# Patient Record
Sex: Female | Born: 1965 | Race: White | Hispanic: No | Marital: Married | Smoking: Never smoker
Health system: Southern US, Community
[De-identification: ages and names within clinical notes are randomized; demographics above are authoritative.]

## PROBLEM LIST (undated history)

## (undated) DIAGNOSIS — I499 Cardiac arrhythmia, unspecified: Secondary | ICD-10-CM

## (undated) DIAGNOSIS — R112 Nausea with vomiting, unspecified: Secondary | ICD-10-CM

## (undated) DIAGNOSIS — Z9889 Other specified postprocedural states: Secondary | ICD-10-CM

## (undated) DIAGNOSIS — E039 Hypothyroidism, unspecified: Secondary | ICD-10-CM

## (undated) DIAGNOSIS — Z8719 Personal history of other diseases of the digestive system: Secondary | ICD-10-CM

## (undated) DIAGNOSIS — F419 Anxiety disorder, unspecified: Secondary | ICD-10-CM

## (undated) DIAGNOSIS — R519 Headache, unspecified: Secondary | ICD-10-CM

## (undated) DIAGNOSIS — T7840XA Allergy, unspecified, initial encounter: Secondary | ICD-10-CM

## (undated) DIAGNOSIS — M1990 Unspecified osteoarthritis, unspecified site: Secondary | ICD-10-CM

## (undated) DIAGNOSIS — J342 Deviated nasal septum: Secondary | ICD-10-CM

## (undated) DIAGNOSIS — J45909 Unspecified asthma, uncomplicated: Secondary | ICD-10-CM

## (undated) DIAGNOSIS — E7800 Pure hypercholesterolemia, unspecified: Secondary | ICD-10-CM

## (undated) HISTORY — PX: CARPAL TUNNEL RELEASE: SHX101

## (undated) HISTORY — PX: KNEE ARTHROSCOPY: SUR90

## (undated) HISTORY — DX: Allergy, unspecified, initial encounter: T78.40XA

## (undated) HISTORY — DX: Unspecified asthma, uncomplicated: J45.909

## (undated) HISTORY — PX: JOINT REPLACEMENT: SHX530

## (undated) HISTORY — DX: Pure hypercholesterolemia, unspecified: E78.00

---

## 2003-09-11 ENCOUNTER — Encounter: Admission: RE | Admit: 2003-09-11 | Discharge: 2003-09-11 | Payer: Self-pay | Admitting: Family Medicine

## 2004-10-02 ENCOUNTER — Encounter: Admission: RE | Admit: 2004-10-02 | Discharge: 2004-10-02 | Payer: Self-pay | Admitting: Family Medicine

## 2004-10-29 ENCOUNTER — Ambulatory Visit (HOSPITAL_COMMUNITY): Admission: RE | Admit: 2004-10-29 | Discharge: 2004-10-29 | Payer: Self-pay | Admitting: Orthopedic Surgery

## 2004-10-29 ENCOUNTER — Ambulatory Visit (HOSPITAL_BASED_OUTPATIENT_CLINIC_OR_DEPARTMENT_OTHER): Admission: RE | Admit: 2004-10-29 | Discharge: 2004-10-29 | Payer: Self-pay | Admitting: Orthopedic Surgery

## 2004-11-19 ENCOUNTER — Ambulatory Visit (HOSPITAL_BASED_OUTPATIENT_CLINIC_OR_DEPARTMENT_OTHER): Admission: RE | Admit: 2004-11-19 | Discharge: 2004-11-19 | Payer: Self-pay | Admitting: Orthopedic Surgery

## 2004-11-19 ENCOUNTER — Ambulatory Visit (HOSPITAL_COMMUNITY): Admission: RE | Admit: 2004-11-19 | Discharge: 2004-11-19 | Payer: Self-pay | Admitting: Orthopedic Surgery

## 2007-11-25 ENCOUNTER — Emergency Department (HOSPITAL_COMMUNITY): Admission: EM | Admit: 2007-11-25 | Discharge: 2007-11-25 | Payer: Self-pay | Admitting: Emergency Medicine

## 2008-05-31 ENCOUNTER — Encounter: Admission: RE | Admit: 2008-05-31 | Discharge: 2008-05-31 | Payer: Self-pay | Admitting: Obstetrics and Gynecology

## 2008-12-09 ENCOUNTER — Telehealth: Payer: Self-pay | Admitting: Family Medicine

## 2010-06-12 NOTE — Op Note (Signed)
NAMEAHMARI, GARTON               ACCOUNT NO.:  [id number]   MEDICAL RECORD NO.:  [id number]          PATIENT TYPE:  AMB   LOCATION:  DSC                          FACILITY:  [facility]   PHYSICIAN:  Loreta Ave, M.D. DATE OF BIRTH:  08/23/1965   DATE OF PROCEDURE:  10/29/2004  DATE OF DISCHARGE:                                 OPERATIVE REPORT   PREOPERATIVE DIAGNOSIS:  Carpal tunnel syndrome, right.   POSTOPERATIVE DIAGNOSIS:  Carpal tunnel syndrome, right.   OPERATIVE PROCEDURE:  Carpal tunnel release, right.   SURGEON:  Loreta Ave, M.D.   ASSISTANT:  Genene Churn. Denton Meek.   ANESTHESIA:  IV regional.   SPECIMENS:  None.   CULTURES:  None.   COMPLICATIONS:  None.   DRESSING:  Soft compressive with a well-padded hand dressing and splint.   PROCEDURE:  The patient brought to the operating room and after adequate  anesthesia had been obtained, tourniquet applied and anesthesia started with  IV regional.  Prepped and draped in the usual sterile fashion.  A small  incision on volar aspect of right wrist over carpal tunnel.  Skin and  subcutaneous tissues divided, avoiding injury to the palmar branch of median  nerve.  Retinaculum over the carpal tunnel identified and incised under  direct visualization from the forearm fascia proximally to the palmar arch  distally.  Marked constriction, edema, erythema of the median nerve which  was pretty much flattened in the carpal canal.  Completely decompressed  including epineurotomy.  Digital branches and motor branches identified,  decompressed and protected.  Improvement of the nerve after completion but  still a pattern of erythema and bruising right in the middle of the canal  even after completion.  No other abnormalities in the canal.  Irrigated.  Hypertrophic synovial tissue removed.  Skin closed with nylon.  Margins were  injected with Marcaine.  Still compressive dressing with short-arm well-  padded hand dressing and  splint applied.  Anesthesia reversed.  Brought to  the recovery room.  Tolerated the surgery well.  No complications.     Loreta Ave, M.D.  Electronically Signed    DFM/MEDQ  D:  10/29/2004  T:  10/29/2004  Job:  [id number]

## 2010-06-12 NOTE — Op Note (Signed)
NAMESHARICE, Shannon Mccullough               ACCOUNT NO.:  [id number]   MEDICAL RECORD NO.:  [id number]          PATIENT TYPE:  AMB   LOCATION:  DSC                          FACILITY:  [facility]   PHYSICIAN:  Loreta Ave, M.D. DATE OF BIRTH:  1965/06/25   DATE OF PROCEDURE:  11/19/2004  DATE OF DISCHARGE:                                 OPERATIVE REPORT   PREOPERATIVE DIAGNOSIS:  Left carpal tunnel syndrome.   POSTOPERATIVE DIAGNOSIS:  Left carpal tunnel syndrome.   PROCEDURE:  Left carpal tunnel release.   SURGEON:  Loreta Ave, M.D.   ASSISTANT:  Genene Churn. Denton Meek.   ANESTHESIA:  IV regional.   SPECIMENS:  None.   CULTURES:  None.   COMPLICATIONS:  None.   DRESSINGS:  Soft compressive with a bulky hand dressing and splint.   DESCRIPTION OF PROCEDURE:  The patient was brought to the operating room,  and after adequate anesthesia had been obtained, she was prepped and draped  in the usual sterile fashion.   A small incision over the volar aspect of the carpal tunnel heading slightly  ulnarward at the distal wrist crease to avoid injury to the palmar branch of  the median nerve.  The skin and subcutaneous tissue were divided.  The  retinaculum over the carpal tunnel was incised under direct visualization  from the forearm fascia proximally to the palmar arch distally.  The digital  branches and motor branches were identified, protected, and decompressed.  Moderate constriction and erythema on the nerve right in the middle of the  canal improved after carpal tunnel release and aponeurotomy.  Completely  decompressed.  No other abnormalities.  Wound irrigated.  Skin closed with  mattress nylon suture.  Sterile compressive dressing applied.  Bulky hand  dressing and splint applied.  Anesthesia reversed.   The patient was brought to the recovery room.  She tolerated the surgery  well, and there were no complications.      Loreta Ave, M.D.  Electronically  Signed     DFM/MEDQ  D:  11/19/2004  T:  11/20/2004  Job:  [id number]

## 2010-10-26 LAB — POCT I-STAT, CHEM 8
BUN: 16
Chloride: 108
HCT: 37
Hemoglobin: 12.6
Potassium: 4.2
Sodium: 140
TCO2: 25

## 2010-10-26 LAB — POCT CARDIAC MARKERS: CKMB, poc: <1 — ABNORMAL LOW

## 2012-06-14 ENCOUNTER — Ambulatory Visit
Admission: RE | Admit: 2012-06-14 | Discharge: 2012-06-14 | Disposition: A | Discharge status: Home / Self Care | Payer: BC Managed Care – PPO | Source: Ambulatory Visit | Attending: Family Medicine | Admitting: Family Medicine

## 2012-06-14 ENCOUNTER — Other Ambulatory Visit: Payer: Self-pay | Admitting: Family Medicine

## 2012-06-14 DIAGNOSIS — M542 Cervicalgia: Secondary | ICD-10-CM

## 2012-06-14 DIAGNOSIS — R51 Headache: Secondary | ICD-10-CM

## 2012-11-16 ENCOUNTER — Encounter: Payer: BC Managed Care – PPO | Attending: Family Medicine | Admitting: Dietician

## 2012-11-16 ENCOUNTER — Encounter: Payer: Self-pay | Admitting: Dietician

## 2012-11-16 VITALS — Ht 62.0 in | Wt 132.7 lb

## 2012-11-16 DIAGNOSIS — Z713 Dietary counseling and surveillance: Secondary | ICD-10-CM | POA: Insufficient documentation

## 2012-11-16 DIAGNOSIS — R7309 Other abnormal glucose: Secondary | ICD-10-CM | POA: Insufficient documentation

## 2012-11-16 NOTE — Progress Notes (Signed)
  Medical Nutrition Therapy:  Appt start time: 1500 end time:  1600.  Assessment:  Primary concerns today: Shannon Mccullough is here today since her blood glucose is elevated. Fasting glucose was 108, and 2-hour OGTT was 141 and has a history of gestational diabetes. BMI is 24.3.   Shannon Mccullough works at [employer] and lives with husband and has two daughters in college. One of her daughters has Type 1 diabetes so she is familiar with counting carbs. States that she does the food shopping and preparing in her household.   Preferred Learning Style:   No preference indicated   Learning Readiness:  Change in progress  MEDICATIONS: see list   DIETARY INTAKE:  Usual eating pattern includes 3 meals and 3 snacks per day.  Avoided foods include red meat    24-hr recall:  B ( AM): kashi bar with diet soda   Snk ( AM): yogurt with diet soda  L ( PM): leftovers or frozen healthy choice lunch Snk ( PM): nuts with cranberries or cheese stick D ( PM): pork tenderloin and couple vegetables maybe with rice, usually with no starch  Snk ( PM): vanilla wafers and nutella Beverages: diet soda (about 3-4 12 oz) or zero calorie flavored water  Usual physical activity: walk the dog or walk with friends 2 x week for about an hour  Estimated energy needs: 1800 calories 200 g carbohydrates 135 g protein 50 g fat  Progress Towards Goal(s):  In progress.   Nutritional Diagnosis:  [place]-2.1 Inpaired nutrition utilization As related to glucose metabolism.  As evidenced by elevated fasting glucose and OGTT.    Intervention:  Nutrition counseling provided. Recommended that Shannon Mccullough consider adding more frequent exercise week and consider cutting back on artificial sweeteners in case they affecting her blood sugar. Also suggested working on managing stress to help keep blood sugar from going too high.   Plan: Aim to get 30 minutes of exercise 5 x per week. Continue to spread carbs out through the day, about 30-60g per meal, 15g  per snack if needed. Consider drinking mostly water or drinks without artificial sweeteners (or tapering down). Work on managing stress and having good sleep habits (if needed). Consider having an Hgb A1c test done at next appointment.   Teaching Method Utilized: Visual - picture of insulin resistance from Living Well with Diabetes Auditory - made recommendations about exercise, spreading out carbs, and managing stress  Handouts given during visit include:  15 g CHO Snacks  Barriers to learning/adherence to lifestyle change: none  Demonstrated degree of understanding via Teach Back   Monitoring/Evaluation:  Dietary intake, exercise, and body weight prn.

## 2012-11-16 NOTE — Patient Instructions (Signed)
Aim to get 30 minutes of exercise 5 x per week. Continue to spread carbs out through the day, about 30-60g per meal, 15g per snack if needed. Consider drinking mostly water or drinks without artificial sweeteners (or tapering down). Work on managing stress and having good sleep habits (if needed). Consider having an Hgb A1c test done at next appointment.

## 2015-04-28 DIAGNOSIS — S83242D Other tear of medial meniscus, current injury, left knee, subsequent encounter: Secondary | ICD-10-CM | POA: Diagnosis not present

## 2015-05-02 DIAGNOSIS — M25562 Pain in left knee: Secondary | ICD-10-CM | POA: Diagnosis not present

## 2015-05-07 DIAGNOSIS — M25562 Pain in left knee: Secondary | ICD-10-CM | POA: Diagnosis not present

## 2015-06-25 DIAGNOSIS — M25562 Pain in left knee: Secondary | ICD-10-CM | POA: Diagnosis not present

## 2015-10-08 DIAGNOSIS — E039 Hypothyroidism, unspecified: Secondary | ICD-10-CM | POA: Diagnosis not present

## 2015-10-08 DIAGNOSIS — Z23 Encounter for immunization: Secondary | ICD-10-CM | POA: Diagnosis not present

## 2015-10-08 DIAGNOSIS — R739 Hyperglycemia, unspecified: Secondary | ICD-10-CM | POA: Diagnosis not present

## 2015-10-08 DIAGNOSIS — J309 Allergic rhinitis, unspecified: Secondary | ICD-10-CM | POA: Diagnosis not present

## 2015-10-08 DIAGNOSIS — E785 Hyperlipidemia, unspecified: Secondary | ICD-10-CM | POA: Diagnosis not present

## 2015-12-01 DIAGNOSIS — N952 Postmenopausal atrophic vaginitis: Secondary | ICD-10-CM | POA: Diagnosis not present

## 2015-12-01 DIAGNOSIS — N390 Urinary tract infection, site not specified: Secondary | ICD-10-CM | POA: Diagnosis not present

## 2015-12-01 DIAGNOSIS — N760 Acute vaginitis: Secondary | ICD-10-CM | POA: Diagnosis not present

## 2016-01-21 DIAGNOSIS — M1712 Unilateral primary osteoarthritis, left knee: Secondary | ICD-10-CM | POA: Diagnosis not present

## 2016-05-05 DIAGNOSIS — M1712 Unilateral primary osteoarthritis, left knee: Secondary | ICD-10-CM | POA: Diagnosis not present

## 2016-06-01 DIAGNOSIS — Z01419 Encounter for gynecological examination (general) (routine) without abnormal findings: Secondary | ICD-10-CM | POA: Diagnosis not present

## 2016-06-01 DIAGNOSIS — Z1231 Encounter for screening mammogram for malignant neoplasm of breast: Secondary | ICD-10-CM | POA: Diagnosis not present

## 2016-06-01 DIAGNOSIS — N760 Acute vaginitis: Secondary | ICD-10-CM | POA: Diagnosis not present

## 2016-06-01 DIAGNOSIS — Z6825 Body mass index (BMI) 25.0-25.9, adult: Secondary | ICD-10-CM | POA: Diagnosis not present

## 2016-07-23 DIAGNOSIS — M25562 Pain in left knee: Secondary | ICD-10-CM | POA: Diagnosis not present

## 2016-07-23 DIAGNOSIS — G8929 Other chronic pain: Secondary | ICD-10-CM | POA: Diagnosis not present

## 2016-07-31 DIAGNOSIS — M25562 Pain in left knee: Secondary | ICD-10-CM | POA: Diagnosis not present

## 2016-07-31 DIAGNOSIS — G8929 Other chronic pain: Secondary | ICD-10-CM | POA: Diagnosis not present

## 2016-08-27 DIAGNOSIS — G8929 Other chronic pain: Secondary | ICD-10-CM | POA: Diagnosis not present

## 2016-08-27 DIAGNOSIS — M25562 Pain in left knee: Secondary | ICD-10-CM | POA: Diagnosis not present

## 2016-08-27 DIAGNOSIS — M1712 Unilateral primary osteoarthritis, left knee: Secondary | ICD-10-CM | POA: Diagnosis not present

## 2016-09-08 DIAGNOSIS — Z0000 Encounter for general adult medical examination without abnormal findings: Secondary | ICD-10-CM | POA: Diagnosis not present

## 2016-09-08 DIAGNOSIS — E785 Hyperlipidemia, unspecified: Secondary | ICD-10-CM | POA: Diagnosis not present

## 2016-09-08 DIAGNOSIS — E039 Hypothyroidism, unspecified: Secondary | ICD-10-CM | POA: Diagnosis not present

## 2016-09-08 DIAGNOSIS — R7303 Prediabetes: Secondary | ICD-10-CM | POA: Diagnosis not present

## 2016-10-15 DIAGNOSIS — M1712 Unilateral primary osteoarthritis, left knee: Secondary | ICD-10-CM | POA: Diagnosis not present

## 2016-10-22 DIAGNOSIS — M1712 Unilateral primary osteoarthritis, left knee: Secondary | ICD-10-CM | POA: Diagnosis not present

## 2016-10-29 DIAGNOSIS — M1712 Unilateral primary osteoarthritis, left knee: Secondary | ICD-10-CM | POA: Diagnosis not present

## 2016-12-23 DIAGNOSIS — M1712 Unilateral primary osteoarthritis, left knee: Secondary | ICD-10-CM | POA: Diagnosis not present

## 2016-12-23 DIAGNOSIS — M25561 Pain in right knee: Secondary | ICD-10-CM | POA: Diagnosis not present

## 2017-02-04 DIAGNOSIS — R1031 Right lower quadrant pain: Secondary | ICD-10-CM | POA: Diagnosis not present

## 2017-02-16 DIAGNOSIS — M62838 Other muscle spasm: Secondary | ICD-10-CM | POA: Diagnosis not present

## 2017-02-16 DIAGNOSIS — M6281 Muscle weakness (generalized): Secondary | ICD-10-CM | POA: Diagnosis not present

## 2017-02-16 DIAGNOSIS — K5900 Constipation, unspecified: Secondary | ICD-10-CM | POA: Diagnosis not present

## 2017-02-16 DIAGNOSIS — N941 Unspecified dyspareunia: Secondary | ICD-10-CM | POA: Diagnosis not present

## 2017-02-23 DIAGNOSIS — M1712 Unilateral primary osteoarthritis, left knee: Secondary | ICD-10-CM | POA: Diagnosis not present

## 2017-02-24 DIAGNOSIS — K5900 Constipation, unspecified: Secondary | ICD-10-CM | POA: Diagnosis not present

## 2017-02-24 DIAGNOSIS — N941 Unspecified dyspareunia: Secondary | ICD-10-CM | POA: Diagnosis not present

## 2017-02-24 DIAGNOSIS — M62838 Other muscle spasm: Secondary | ICD-10-CM | POA: Diagnosis not present

## 2017-02-24 DIAGNOSIS — M6281 Muscle weakness (generalized): Secondary | ICD-10-CM | POA: Diagnosis not present

## 2017-03-09 DIAGNOSIS — M6281 Muscle weakness (generalized): Secondary | ICD-10-CM | POA: Diagnosis not present

## 2017-03-09 DIAGNOSIS — N941 Unspecified dyspareunia: Secondary | ICD-10-CM | POA: Diagnosis not present

## 2017-03-09 DIAGNOSIS — K5900 Constipation, unspecified: Secondary | ICD-10-CM | POA: Diagnosis not present

## 2017-03-09 DIAGNOSIS — M62838 Other muscle spasm: Secondary | ICD-10-CM | POA: Diagnosis not present

## 2017-03-23 DIAGNOSIS — R109 Unspecified abdominal pain: Secondary | ICD-10-CM | POA: Diagnosis not present

## 2017-03-24 ENCOUNTER — Other Ambulatory Visit: Payer: Self-pay | Admitting: Family Medicine

## 2017-03-24 DIAGNOSIS — R109 Unspecified abdominal pain: Secondary | ICD-10-CM

## 2017-04-07 ENCOUNTER — Ambulatory Visit
Admission: RE | Admit: 2017-04-07 | Discharge: 2017-04-07 | Disposition: A | Discharge status: Home / Self Care | Payer: BLUE CROSS/BLUE SHIELD | Source: Ambulatory Visit | Attending: Family Medicine | Admitting: Family Medicine

## 2017-04-07 DIAGNOSIS — R1031 Right lower quadrant pain: Secondary | ICD-10-CM | POA: Diagnosis not present

## 2017-04-07 DIAGNOSIS — R109 Unspecified abdominal pain: Secondary | ICD-10-CM

## 2017-04-07 MED ORDER — IOPAMIDOL (ISOVUE-300) INJECTION 61%
100.0000 mL | Freq: Once | INTRAVENOUS | Status: AC | PRN
  Administered 2017-04-07: 100 mL via INTRAVENOUS

## 2017-07-13 DIAGNOSIS — Z1231 Encounter for screening mammogram for malignant neoplasm of breast: Secondary | ICD-10-CM | POA: Diagnosis not present

## 2017-07-13 DIAGNOSIS — Z6825 Body mass index (BMI) 25.0-25.9, adult: Secondary | ICD-10-CM | POA: Diagnosis not present

## 2017-07-13 DIAGNOSIS — Z01419 Encounter for gynecological examination (general) (routine) without abnormal findings: Secondary | ICD-10-CM | POA: Diagnosis not present

## 2017-07-13 DIAGNOSIS — Z1239 Encounter for other screening for malignant neoplasm of breast: Secondary | ICD-10-CM | POA: Diagnosis not present

## 2017-07-15 DIAGNOSIS — R1031 Right lower quadrant pain: Secondary | ICD-10-CM | POA: Diagnosis not present

## 2017-07-15 DIAGNOSIS — K469 Unspecified abdominal hernia without obstruction or gangrene: Secondary | ICD-10-CM | POA: Diagnosis not present

## 2017-07-15 DIAGNOSIS — J309 Allergic rhinitis, unspecified: Secondary | ICD-10-CM | POA: Diagnosis not present

## 2017-07-15 DIAGNOSIS — E039 Hypothyroidism, unspecified: Secondary | ICD-10-CM | POA: Diagnosis not present

## 2017-08-09 DIAGNOSIS — R109 Unspecified abdominal pain: Secondary | ICD-10-CM | POA: Diagnosis not present

## 2017-09-22 DIAGNOSIS — R7303 Prediabetes: Secondary | ICD-10-CM | POA: Diagnosis not present

## 2017-09-22 DIAGNOSIS — Z5181 Encounter for therapeutic drug level monitoring: Secondary | ICD-10-CM | POA: Diagnosis not present

## 2017-09-22 DIAGNOSIS — E039 Hypothyroidism, unspecified: Secondary | ICD-10-CM | POA: Diagnosis not present

## 2017-09-22 DIAGNOSIS — E785 Hyperlipidemia, unspecified: Secondary | ICD-10-CM | POA: Diagnosis not present

## 2017-09-23 DIAGNOSIS — F411 Generalized anxiety disorder: Secondary | ICD-10-CM | POA: Diagnosis not present

## 2017-09-23 DIAGNOSIS — Z23 Encounter for immunization: Secondary | ICD-10-CM | POA: Diagnosis not present

## 2017-09-23 DIAGNOSIS — Z0000 Encounter for general adult medical examination without abnormal findings: Secondary | ICD-10-CM | POA: Diagnosis not present

## 2017-09-28 DIAGNOSIS — H17822 Peripheral opacity of cornea, left eye: Secondary | ICD-10-CM | POA: Diagnosis not present

## 2017-10-28 DIAGNOSIS — R109 Unspecified abdominal pain: Secondary | ICD-10-CM | POA: Diagnosis not present

## 2017-12-15 DIAGNOSIS — H16142 Punctate keratitis, left eye: Secondary | ICD-10-CM | POA: Diagnosis not present

## 2018-06-01 ENCOUNTER — Ambulatory Visit: Payer: Self-pay | Admitting: Surgery

## 2018-06-01 DIAGNOSIS — R109 Unspecified abdominal pain: Secondary | ICD-10-CM | POA: Diagnosis not present

## 2018-06-01 NOTE — H&P (Signed)
Surgical H&P   CC: abdominal pain  HPI: this is a very nice and otherwise healthy 53 year old woman who I first met in July of last year.  She notes an intermittent bulge and pain in the right lower quadrant. This has been going on for about 2-1/2 years, starting after she had helped her parents move some heavy boxes. The bulge is more noticeable after physical activity, but she also notes that she has pain when her bladder is full.  This is her third visit to the office for this, at her last visit I recommended a diagnostic laparoscopy and possible repair because although I was never able to ascertain a hernia on exam, the history is consistent with a hernia. She is now experiencing this 3 or 4 times a week.  It is getting more difficult to reduce and she does have to lie down and try to relax her muscles to reduce it.  She does feel that stress aggravates it.  Denies any associated GI symptoms although she does have a history of chronic constipation. Her prior abdominal surgical history includes a C-section and a laparoscopy for ectopic pregnancy, neither of these incisions or near the region of her pain.  Allergies  Allergen Reactions  . Sulfa Antibiotics     Past Medical History:  Diagnosis Date  . Asthma     Past Surgical History:  Procedure Laterality Date  . CARPAL TUNNEL RELEASE    . CESAREAN SECTION    . KNEE ARTHROSCOPY      Family History  Problem Relation Age of Onset  . Cancer Other   . Hyperlipidemia Other   . Hypertension Other   . Diabetes Other     Social History   Socioeconomic History  . Marital status: Married    Spouse name: Not on file  . Number of children: Not on file  . Years of education: Not on file  . Highest education level: Not on file  Occupational History  . Not on file  Social Needs  . Financial resource strain: Not on file  . Food insecurity:    Worry: Not on file    Inability: Not on file  . Transportation needs:    Medical: Not on  file    Non-medical: Not on file  Tobacco Use  . Smoking status: Not on file  Substance and Sexual Activity  . Alcohol use: Not on file  . Drug use: Not on file  . Sexual activity: Not on file  Lifestyle  . Physical activity:    Days per week: Not on file    Minutes per session: Not on file  . Stress: Not on file  Relationships  . Social connections:    Talks on phone: Not on file    Gets together: Not on file    Attends religious service: Not on file    Active member of club or organization: Not on file    Attends meetings of clubs or organizations: Not on file    Relationship status: Not on file  Other Topics Concern  . Not on file  Social History Narrative  . Not on file    Current Outpatient Medications on File Prior to Visit  Medication Sig Dispense Refill  . CALCIUM-VITAMIN D PO Take by mouth.    . cetirizine (ZYRTEC) 10 MG tablet Take 10 mg by mouth daily.    Marland Kitchen guaiFENesin (MUCINEX) 600 MG 12 hr tablet Take 1,200 mg by mouth 2 (two) times daily.    Marland Kitchen  levothyroxine (SYNTHROID, LEVOTHROID) 100 MCG tablet Take 100 mcg by mouth daily before breakfast.    . Multiple Vitamin (MULTIVITAMIN WITH MINERALS) TABS tablet Take 1 tablet by mouth daily.    . sertraline (ZOLOFT) 100 MG tablet Take 100 mg by mouth daily.    . vitamin C (ASCORBIC ACID) 500 MG tablet Take 500 mg by mouth daily.     No current facility-administered medications on file prior to visit.     Review of Systems: a complete, 10pt review of systems was completed with pertinent positives and negatives as documented in the HPI  Physical Exam: There were no vitals filed for this visit. Gen: A&Ox3, no distress  Head: normocephalic, atraumatic Eyes: extraocular motions intact, anicteric.  Neck: supple without mass or thyromegaly Chest: unlabored respirations, symmetrical air entry, clear bilaterally   Cardiovascular: RRR with palpable distal pulses, no pedal edema Abdomen: soft, nondistended, nontender. No  mass or organomegaly.  Extremities: warm, without edema, no deformities  Neuro: grossly intact Psych: appropriate mood and affect, normal insight  Skin: warm and dry   CBC 11/25/2007  Hemoglobin 12.6  Hematocrit 37.0    CMP 11/25/2007  Glucose 106(H)  BUN 16  Creatinine 1.0  Sodium 140  Potassium 4.2  Chloride 108    No results found for: INR, PROTIME  Imaging: No results found.    A/P: Again, her history sounds like she has an abdominal wall hernia. I discussed with her the possibility of a spigelian hernia which can be difficult to appreciate on exam but given the location and symptoms she describes I think this is the most likely diagnosis. I looked again at her CT scan, there is no overt hernia or defect, but on a single slice near the level of the ASIS, the posterior fascia is difficult to delineate, the remainder of the skan it is easy to see. I again offered her diagnostic laparoscopy with concomitant hernia repair if confirmed. We discussed both the transabdominal preperitoneal approach in the intraperitoneal onlay approach both using mesh. We discussed the risks of surgery including bleeding, infection, pain, scarring, injury to intra-abdominal injury, hernia recurrence, failure to resolve symptoms, conversion to open surgery, as well as risks of blood clots, pneumonia, heart attack, stroke, etc. We discussed signs and symptoms of incarceration that should prompt emergency evaluation and she states understanding. She is ready to proceed with scheduling.   Romana Juniper, MD [facility] Surgery, [place] Pager [phone]

## 2018-07-02 NOTE — H&P (Signed)
Surgical H&P   CC: abdominal pain  HPI: this is a very nice and otherwise healthy 53 year old woman who I first met in July of last year.  She notes an intermittent bulge and pain in the right lower quadrant. This has been going on for about 2-1/2 years, starting after she had helped her parents move some heavy boxes. The bulge is more noticeable after physical activity, but she also notes that she has pain when her bladder is full.  This is her third visit to the office for this, at her last visit I recommended a diagnostic laparoscopy and possible repair because although I was never able to ascertain a hernia on exam, the history is consistent with a hernia. She is now experiencing this 3 or 4 times a week.  It is getting more difficult to reduce and she does have to lie down and try to relax her muscles to reduce it.  She does feel that stress aggravates it.  Denies any associated GI symptoms although she does have a history of chronic constipation. Her prior abdominal surgical history includes a C-section and a laparoscopy for ectopic pregnancy, neither of these incisions or near the region of her pain.      Allergies  Allergen Reactions  . Sulfa Antibiotics         Past Medical History:  Diagnosis Date  . Asthma          Past Surgical History:  Procedure Laterality Date  . CARPAL TUNNEL RELEASE    . CESAREAN SECTION    . KNEE ARTHROSCOPY           Family History  Problem Relation Age of Onset  . Cancer Other   . Hyperlipidemia Other   . Hypertension Other   . Diabetes Other     Social History        Socioeconomic History  . Marital status: Married    Spouse name: Not on file  . Number of children: Not on file  . Years of education: Not on file  . Highest education level: Not on file  Occupational History  . Not on file  Social Needs  . Financial resource strain: Not on file  . Food insecurity:    Worry: Not on file    Inability:  Not on file  . Transportation needs:    Medical: Not on file    Non-medical: Not on file  Tobacco Use  . Smoking status: Not on file  Substance and Sexual Activity  . Alcohol use: Not on file  . Drug use: Not on file  . Sexual activity: Not on file  Lifestyle  . Physical activity:    Days per week: Not on file    Minutes per session: Not on file  . Stress: Not on file  Relationships  . Social connections:    Talks on phone: Not on file    Gets together: Not on file    Attends religious service: Not on file    Active member of club or organization: Not on file    Attends meetings of clubs or organizations: Not on file    Relationship status: Not on file  Other Topics Concern  . Not on file  Social History Narrative  . Not on file          Current Outpatient Medications on File Prior to Visit  Medication Sig Dispense Refill  . CALCIUM-VITAMIN D PO Take by mouth.    . cetirizine (ZYRTEC) 10  MG tablet Take 10 mg by mouth daily.    Marland Kitchen guaiFENesin (MUCINEX) 600 MG 12 hr tablet Take 1,200 mg by mouth 2 (two) times daily.    Marland Kitchen levothyroxine (SYNTHROID, LEVOTHROID) 100 MCG tablet Take 100 mcg by mouth daily before breakfast.    . Multiple Vitamin (MULTIVITAMIN WITH MINERALS) TABS tablet Take 1 tablet by mouth daily.    . sertraline (ZOLOFT) 100 MG tablet Take 100 mg by mouth daily.    . vitamin C (ASCORBIC ACID) 500 MG tablet Take 500 mg by mouth daily.     No current facility-administered medications on file prior to visit.     Review of Systems: a complete, 10pt review of systems was completed with pertinent positives and negatives as documented in the HPI  Physical Exam: There were no vitals filed for this visit. Gen: A&Ox3, no distress  Head: normocephalic, atraumatic Eyes: extraocular motions intact, anicteric.  Neck: supple without mass or thyromegaly Chest: unlabored respirations, symmetrical air entry, clear bilaterally    Cardiovascular: RRR with palpable distal pulses, no pedal edema Abdomen: soft, nondistended, nontender. No mass or organomegaly.  Extremities: warm, without edema, no deformities  Neuro: grossly intact Psych: appropriate mood and affect, normal insight  Skin: warm and dry   CBC 11/25/2007  Hemoglobin 12.6  Hematocrit 37.0    CMP 11/25/2007  Glucose 106(H)  BUN 16  Creatinine 1.0  Sodium 140  Potassium 4.2  Chloride 108    RecentLabs  No results found for: INR, PROTIME    Imaging: ImagingResults(Last48hours)  No results found.      A/P: Again, her history sounds like she has an abdominal wall hernia. I discussed with her the possibility of a spigelian hernia which can be difficult to appreciate on exam but given the location and symptoms she describes I think this is the most likely diagnosis. I looked again at her CT scan, there is no overt hernia or defect, but on a single slice near the level of the ASIS, the posterior fascia is difficult to delineate, the remainder of the skan it is easy to see. I again offered her diagnostic laparoscopy with concomitant hernia repair if confirmed. We discussed both the transabdominal preperitoneal approach in the intraperitoneal onlay approach both using mesh. We discussed the risks of surgery including bleeding, infection, pain, scarring, injury to intra-abdominal injury, hernia recurrence, failure to resolve symptoms, conversion to open surgery, as well as risks of blood clots, pneumonia, heart attack, stroke, etc. We discussed signs and symptoms of incarceration that should prompt emergency evaluation and she states understanding. She is ready to proceed with scheduling.   Romana Juniper, MD [facility] Surgery, [place] Pager [phone]

## 2018-07-03 ENCOUNTER — Encounter (HOSPITAL_COMMUNITY): Payer: Self-pay

## 2018-07-03 NOTE — Pre-Procedure Instructions (Signed)
Shannon Mccullough  07/03/2018     [id number] - [place], [place] - [address] [place] [address] Phone: [phone] Fax: [phone]   Your procedure is scheduled on Wednesday, June 10th  Report to [facility] Entrance A at 11:30 A.M.  Call this number if you have problems the morning of surgery:  [phone]   Remember:  Do not eat or drink after midnight.    Take these medicines the morning of surgery with A SIP OF WATER  levothyroxine (SYNTHROID)   If needed - acetaminophen (TYLENOL) , fluticasone (FLONASE)  Follow your surgeon's instructions on when to stop Aspirin.  If no instructions were given by your surgeon then you will need to call the office to get those instructions.     7 days prior to surgery STOP taking any Aspirin (unless otherwise instructed by your surgeon), Aleve, Naproxen, Ibuprofen, Motrin, Advil, Goody's, BC's, all herbal medications, fish oil, and all vitamins.    Do not wear jewelry, make-up or nail polish.  Do not wear lotions, powders, or perfumes, or deodorant.  Do not shave 48 hours prior to surgery.  Men may shave face and neck.  Do not bring valuables to the hospital.  [facility] is not responsible for any belongings or valuables.  Contacts, dentures or bridgework may not be worn into surgery.  Leave your suitcase in the car.  After surgery it may be brought to your room.  For patients admitted to the hospital, discharge time will be determined by your treatment team.  Patients discharged the day of surgery will not be allowed to drive home.   Special instructions:  [facility]- Preparing For Surgery  Before surgery, you can play an important role. Because skin is not sterile, your skin needs to be as free of germs as possible. You can reduce the number of germs on your skin by washing with CHG (chlorahexidine gluconate) Soap before surgery.  CHG is an antiseptic  cleaner which kills germs and bonds with the skin to continue killing germs even after washing.    Oral Hygiene is also important to reduce your risk of infection.  Remember - BRUSH YOUR TEETH THE MORNING OF SURGERY WITH YOUR REGULAR TOOTHPASTE  Please do not use if you have an allergy to CHG or antibacterial soaps. If your skin becomes reddened/irritated stop using the CHG.  Do not shave (including legs and underarms) for at least 48 hours prior to first CHG shower. It is OK to shave your face.  Please follow these instructions carefully.   1. Shower the NIGHT BEFORE SURGERY and the MORNING OF SURGERY with CHG.   2. If you chose to wash your hair, wash your hair first as usual with your normal shampoo.  3. After you shampoo, rinse your hair and body thoroughly to remove the shampoo.  4. Use CHG as you would any other liquid soap. You can apply CHG directly to the skin and wash gently with a scrungie or a clean washcloth.   5. Apply the CHG Soap to your body ONLY FROM THE NECK DOWN.  Do not use on open wounds or open sores. Avoid contact with your eyes, ears, mouth and genitals (private parts). Wash Face and genitals (private parts)  with your normal soap.  6. Wash thoroughly, paying special attention to the area where your surgery will be performed.  7. Thoroughly rinse your body with warm water  from the neck down.  8. DO NOT shower/wash with your normal soap after using and rinsing off the CHG Soap.  9. Pat yourself dry with a CLEAN TOWEL.  10. Wear CLEAN PAJAMAS to bed the night before surgery, wear comfortable clothes the morning of surgery  11. Place CLEAN SHEETS on your bed the night of your first shower and DO NOT SLEEP WITH PETS.  Day of Surgery:  Do not apply any deodorants/lotions.  Please wear clean clothes to the hospital/surgery center.   Remember to brush your teeth WITH YOUR REGULAR TOOTHPASTE.  Please read over the following fact sheets that you were given. Pain  Booklet and Coughing and Deep Breathing

## 2018-07-04 ENCOUNTER — Encounter (HOSPITAL_COMMUNITY): Payer: Self-pay

## 2018-07-04 ENCOUNTER — Other Ambulatory Visit: Payer: Self-pay

## 2018-07-04 ENCOUNTER — Encounter (HOSPITAL_COMMUNITY)
Admission: RE | Admit: 2018-07-04 | Discharge: 2018-07-04 | Disposition: A | Discharge status: Home / Self Care | Payer: BC Managed Care – PPO | Source: Ambulatory Visit | Attending: Surgery | Admitting: Surgery

## 2018-07-04 ENCOUNTER — Other Ambulatory Visit (HOSPITAL_COMMUNITY)
Admission: RE | Admit: 2018-07-04 | Discharge: 2018-07-04 | Disposition: A | Discharge status: Home / Self Care | Payer: BC Managed Care – PPO | Source: Ambulatory Visit | Attending: Surgery | Admitting: Surgery

## 2018-07-04 DIAGNOSIS — Z79890 Hormone replacement therapy (postmenopausal): Secondary | ICD-10-CM | POA: Insufficient documentation

## 2018-07-04 DIAGNOSIS — Z7951 Long term (current) use of inhaled steroids: Secondary | ICD-10-CM | POA: Diagnosis not present

## 2018-07-04 DIAGNOSIS — Z79899 Other long term (current) drug therapy: Secondary | ICD-10-CM | POA: Diagnosis not present

## 2018-07-04 DIAGNOSIS — K439 Ventral hernia without obstruction or gangrene: Secondary | ICD-10-CM | POA: Diagnosis not present

## 2018-07-04 DIAGNOSIS — Z01818 Encounter for other preprocedural examination: Secondary | ICD-10-CM | POA: Diagnosis not present

## 2018-07-04 DIAGNOSIS — E039 Hypothyroidism, unspecified: Secondary | ICD-10-CM | POA: Insufficient documentation

## 2018-07-04 DIAGNOSIS — Z1159 Encounter for screening for other viral diseases: Secondary | ICD-10-CM | POA: Insufficient documentation

## 2018-07-04 DIAGNOSIS — J45909 Unspecified asthma, uncomplicated: Secondary | ICD-10-CM | POA: Diagnosis not present

## 2018-07-04 HISTORY — DX: Other specified postprocedural states: Z98.890

## 2018-07-04 HISTORY — DX: Nausea with vomiting, unspecified: R11.2

## 2018-07-04 HISTORY — DX: Personal history of other diseases of the digestive system: Z87.19

## 2018-07-04 HISTORY — DX: Anxiety disorder, unspecified: F41.9

## 2018-07-04 HISTORY — DX: Unspecified osteoarthritis, unspecified site: M19.90

## 2018-07-04 HISTORY — DX: Cardiac arrhythmia, unspecified: I49.9

## 2018-07-04 HISTORY — DX: Hypothyroidism, unspecified: E03.9

## 2018-07-04 HISTORY — DX: Headache, unspecified: R51.9

## 2018-07-04 HISTORY — DX: Deviated nasal septum: J34.2

## 2018-07-04 LAB — BASIC METABOLIC PANEL
Anion gap: 8 (ref 5–15)
BUN: 12 mg/dL (ref 6–20)
CO2: 25 mmol/L (ref 22–32)
Calcium: 9.3 mg/dL (ref 8.9–10.3)
Chloride: 106 mmol/L (ref 98–111)
Creatinine, Ser: 0.8 mg/dL (ref 0.44–1.00)
GFR calc Af Amer: >60 mL/min (ref >60)
GFR calc non Af Amer: >60 mL/min (ref >60)
Glucose, Bld: 84 mg/dL (ref 70–99)
Potassium: 4.2 mmol/L (ref 3.5–5.1)
Sodium: 139 mmol/L (ref 135–145)

## 2018-07-04 LAB — CBC WITH DIFFERENTIAL/PLATELET
Abs Immature Granulocytes: 0 10*3/uL (ref 0.00–0.07)
Basophils Absolute: 0 10*3/uL (ref 0.0–0.1)
Basophils Relative: 0 %
Eosinophils Absolute: 0.1 10*3/uL (ref 0.0–0.5)
Eosinophils Relative: 3 %
HCT: 37.5 % (ref 36.0–46.0)
Hemoglobin: 12.1 g/dL (ref 12.0–15.0)
Immature Granulocytes: 0 %
Lymphocytes Relative: 37 %
Lymphs Abs: 1.7 10*3/uL (ref 0.7–4.0)
MCH: 30.3 pg (ref 26.0–34.0)
MCHC: 32.3 g/dL (ref 30.0–36.0)
MCV: 94 fL (ref 80.0–100.0)
Monocytes Absolute: 0.4 10*3/uL (ref 0.1–1.0)
Monocytes Relative: 9 %
Neutro Abs: 2.4 10*3/uL (ref 1.7–7.7)
Neutrophils Relative %: 51 %
Platelets: 254 10*3/uL (ref 150–400)
RBC: 3.99 MIL/uL (ref 3.87–5.11)
RDW: 12.1 % (ref 11.5–15.5)
WBC: 4.6 10*3/uL (ref 4.0–10.5)
nRBC: 0 % (ref 0.0–0.2)

## 2018-07-04 LAB — SARS CORONAVIRUS 2 BY RT PCR (HOSPITAL ORDER, PERFORMED IN ~~LOC~~ HOSPITAL LAB): SARS Coronavirus 2: NEGATIVE

## 2018-07-04 MED ORDER — CHLORHEXIDINE GLUCONATE 4 % EX LIQD: 60.0000 mL | Freq: Once | CUTANEOUS | Status: DC

## 2018-07-04 MED ORDER — BUPIVACAINE LIPOSOME 1.3 % IJ SUSP
20.0000 mL | INTRAMUSCULAR | Status: AC
  Administered 2018-07-05: 15:00:00 20 mL
  Filled 2018-07-04 (×2): qty 20

## 2018-07-04 NOTE — Progress Notes (Signed)
PCP - Dr Justin Mend  With [place] Cardiologist -none   Chest x-ray - none EKG - 07/04/18 Stress Test - none -   Sleep Study - neg CPAP - na   s: Anesthesia review: some pvcs in past   No ekg found  Patient denies shortness of breath, fever, cough and chest pain at PAT appointment   Patient verbalized understanding of instructions that were given to them at the PAT appointment. Patient was also instructed that they will need to review over the PAT instructions again at home before surgery.

## 2018-07-04 NOTE — Anesthesia Preprocedure Evaluation (Addendum)
Anesthesia Evaluation  Patient identified by MRN, date of birth, ID band Patient awake    Reviewed: Allergy & Precautions, NPO status   History of Anesthesia Complications (+) PONV and history of anesthetic complications  Airway Mallampati: I       Dental no notable dental hx. (+) Teeth Intact   Pulmonary    Pulmonary exam normal breath sounds clear to auscultation       Cardiovascular Normal cardiovascular exam Rhythm:Regular Rate:Normal     Neuro/Psych    GI/Hepatic   Endo/Other  Hypothyroidism   Renal/GU   negative genitourinary   Musculoskeletal   Abdominal Normal abdominal exam  (+)   Peds  Hematology negative hematology ROS (+)   Anesthesia Other Findings   Reproductive/Obstetrics                             Anesthesia Physical Anesthesia Plan  ASA: II  Anesthesia Plan: General   Post-op Pain Management:    Induction: Intravenous  PONV Risk Score and Plan: 4 or greater and Ondansetron, Scopolamine patch - Pre-op, Midazolam and Dexamethasone  Airway Management Planned: Oral ETT  Additional Equipment:   Intra-op Plan:   Post-operative Plan: Extubation in OR  Informed Consent: I have reviewed the patients History and Physical, chart, labs and discussed the procedure including the risks, benefits and alternatives for the proposed anesthesia with the patient or authorized representative who has indicated his/her understanding and acceptance.     Dental advisory given  Plan Discussed with: CRNA  Anesthesia Plan Comments: (PAT note written 07/04/2018 by Myra Gianotti, PA-C. )       Anesthesia Quick Evaluation

## 2018-07-04 NOTE — Progress Notes (Signed)
Anesthesia Chart Review:  Case:  [id number] Date/Time:  07/05/18 1315   Procedure:  LAPAROSCOPIC ASSISTED SPIGELIAN HERNIA REPAIR WITH MESH (N/A )   Anesthesia type:  General   Pre-op diagnosis:  SPIGELIAN HERNIA   Location:  [facility] OR ROOM 02 / [facility]   Surgeon:  Clovis Riley, MD      DISCUSSION: Patient is a 53 year old female scheduled for the above procedure.  History includes never smoker, post-operative N/V, asthma, hiatal hernia, dysrhythmia (history of PVCs), hypothyroidism.  EKG showed NSR. No chest pain, SOB, cough, fever at PAT RN visit. Preoperative labs WNL. Preoperative COVID test negative on 07/04/18. If no acute changes then I would anticipate that she can proceed as planned.   VS: BP 113/70   Pulse 79   Temp 36.8 C   Resp 18   Ht 5\' 3"  (1.6 m)   Wt 64.8 kg   LMP 06/02/2012   SpO2 100%   BMI 25.31 kg/m   PROVIDERS: Maurice Small, MD is PCP   LABS: Labs reviewed: Acceptable for surgery. (all labs ordered are listed, but only abnormal results are displayed)  Labs Reviewed  BASIC METABOLIC PANEL  CBC WITH DIFFERENTIAL/PLATELET    EKG: 07/04/18: NSR   CV: Denied prior stress test. No recent CV testing.    Past Medical History:  Diagnosis Date  . Anxiety   . Arthritis   . Asthma   . Deviated septum   . Dysrhythmia    pvc  occ  . Headache   . History of hiatal hernia   . Hypothyroidism   . PONV (postoperative nausea and vomiting)     Past Surgical History:  Procedure Laterality Date  . CARPAL TUNNEL RELEASE    . CESAREAN SECTION    . JOINT REPLACEMENT     x3 scopes  . KNEE ARTHROSCOPY      MEDICATIONS: . acetaminophen (TYLENOL) 500 MG tablet  . CALCIUM-VITAMIN D PO  . cetirizine (ZYRTEC) 10 MG tablet  . docusate sodium (COLACE) 100 MG capsule  . fluticasone (FLONASE) 50 MCG/ACT nasal spray  . guaiFENesin (MUCINEX) 600 MG 12 hr tablet  . levothyroxine (SYNTHROID) 75 MCG tablet  . Multiple Vitamin (MULTIVITAMIN WITH MINERALS) TABS tablet    . chlorhexidine (HIBICLENS) 4 % liquid 4 application   And  . [START ON 07/05/2018] chlorhexidine (HIBICLENS) 4 % liquid 4 application   . [START ON 07/05/2018] bupivacaine liposome (EXPAREL) 1.3 % injection 266 mg    Myra Gianotti, PA-C Surgical Short Stay/Anesthesiology [facility] Phone [phone] [facility] Phone [phone] 07/04/2018 1:17 PM

## 2018-07-05 ENCOUNTER — Other Ambulatory Visit: Payer: Self-pay

## 2018-07-05 ENCOUNTER — Ambulatory Visit (HOSPITAL_COMMUNITY): Payer: BC Managed Care – PPO | Admitting: Certified Registered Nurse Anesthetist

## 2018-07-05 ENCOUNTER — Encounter (HOSPITAL_COMMUNITY): Payer: Self-pay

## 2018-07-05 ENCOUNTER — Encounter (HOSPITAL_COMMUNITY)
Admission: RE | Disposition: A | Discharge status: Home / Self Care | Payer: Self-pay | Source: Home / Self Care | Attending: Surgery

## 2018-07-05 ENCOUNTER — Ambulatory Visit (HOSPITAL_COMMUNITY): Payer: BC Managed Care – PPO | Admitting: Physician Assistant

## 2018-07-05 ENCOUNTER — Ambulatory Visit (HOSPITAL_COMMUNITY)
Admission: RE | Admit: 2018-07-05 | Discharge: 2018-07-05 | Disposition: A | Discharge status: Home / Self Care | Payer: BC Managed Care – PPO | Attending: Surgery | Admitting: Surgery

## 2018-07-05 DIAGNOSIS — E039 Hypothyroidism, unspecified: Secondary | ICD-10-CM | POA: Diagnosis not present

## 2018-07-05 DIAGNOSIS — Z882 Allergy status to sulfonamides status: Secondary | ICD-10-CM | POA: Diagnosis not present

## 2018-07-05 DIAGNOSIS — J45909 Unspecified asthma, uncomplicated: Secondary | ICD-10-CM | POA: Diagnosis not present

## 2018-07-05 DIAGNOSIS — Z79899 Other long term (current) drug therapy: Secondary | ICD-10-CM | POA: Insufficient documentation

## 2018-07-05 DIAGNOSIS — Z79890 Hormone replacement therapy (postmenopausal): Secondary | ICD-10-CM | POA: Insufficient documentation

## 2018-07-05 DIAGNOSIS — K439 Ventral hernia without obstruction or gangrene: Secondary | ICD-10-CM | POA: Insufficient documentation

## 2018-07-05 HISTORY — PX: LAPAROSCOPIC ASSISTED SPIGELIAN HERNIA REPAIR: SHX6763

## 2018-07-05 SURGERY — REPAIR, HERNIA, SPIGELIAN, LAPAROSCOPIC
Anesthesia: General | Site: Abdomen

## 2018-07-05 MED ORDER — SODIUM CHLORIDE 0.9 % IV SOLN
INTRAVENOUS | Status: DC | PRN
  Administered 2018-07-05: 20 ug/min via INTRAVENOUS

## 2018-07-05 MED ORDER — SCOPOLAMINE 1 MG/3DAYS TD PT72
MEDICATED_PATCH | TRANSDERMAL | Status: AC
  Filled 2018-07-05: qty 1

## 2018-07-05 MED ORDER — MIDAZOLAM HCL 2 MG/2ML IJ SOLN
INTRAMUSCULAR | Status: AC
  Filled 2018-07-05: qty 2

## 2018-07-05 MED ORDER — DEXAMETHASONE SODIUM PHOSPHATE 10 MG/ML IJ SOLN
INTRAMUSCULAR | Status: DC | PRN
  Administered 2018-07-05: 4 mg via INTRAVENOUS

## 2018-07-05 MED ORDER — MIDAZOLAM HCL 2 MG/2ML IJ SOLN
INTRAMUSCULAR | Status: DC | PRN
  Administered 2018-07-05: 2 mg via INTRAVENOUS

## 2018-07-05 MED ORDER — PROPOFOL 10 MG/ML IV BOLUS
INTRAVENOUS | Status: AC
  Filled 2018-07-05: qty 20

## 2018-07-05 MED ORDER — LIDOCAINE 2% (20 MG/ML) 5 ML SYRINGE
INTRAMUSCULAR | Status: DC | PRN
  Administered 2018-07-05: 100 mg via INTRAVENOUS

## 2018-07-05 MED ORDER — SCOPOLAMINE 1 MG/3DAYS TD PT72
MEDICATED_PATCH | TRANSDERMAL | Status: DC | PRN
  Administered 2018-07-05: 1 via TRANSDERMAL

## 2018-07-05 MED ORDER — SUGAMMADEX SODIUM 200 MG/2ML IV SOLN
INTRAVENOUS | Status: DC | PRN
  Administered 2018-07-05: 150 mg via INTRAVENOUS

## 2018-07-05 MED ORDER — LACTATED RINGERS IV SOLN
INTRAVENOUS | Status: DC
  Administered 2018-07-05 (×2): via INTRAVENOUS

## 2018-07-05 MED ORDER — FENTANYL CITRATE (PF) 100 MCG/2ML IJ SOLN
INTRAMUSCULAR | Status: DC | PRN
  Administered 2018-07-05: 50 ug via INTRAVENOUS
  Administered 2018-07-05: 100 ug via INTRAVENOUS

## 2018-07-05 MED ORDER — FENTANYL CITRATE (PF) 250 MCG/5ML IJ SOLN
INTRAMUSCULAR | Status: AC
  Filled 2018-07-05: qty 5

## 2018-07-05 MED ORDER — BUPIVACAINE-EPINEPHRINE (PF) 0.25% -1:200000 IJ SOLN
INTRAMUSCULAR | Status: AC
  Filled 2018-07-05: qty 30

## 2018-07-05 MED ORDER — DEXAMETHASONE SODIUM PHOSPHATE 10 MG/ML IJ SOLN
INTRAMUSCULAR | Status: AC
  Filled 2018-07-05: qty 1

## 2018-07-05 MED ORDER — ONDANSETRON HCL 4 MG/2ML IJ SOLN
INTRAMUSCULAR | Status: AC
  Filled 2018-07-05: qty 2

## 2018-07-05 MED ORDER — BUPIVACAINE-EPINEPHRINE 0.25% -1:200000 IJ SOLN
INTRAMUSCULAR | Status: DC | PRN
  Administered 2018-07-05: 30 mL

## 2018-07-05 MED ORDER — ACETAMINOPHEN 500 MG PO TABS
ORAL_TABLET | ORAL | Status: AC
  Administered 2018-07-05: 12:00:00 1000 mg via ORAL
  Filled 2018-07-05: qty 2

## 2018-07-05 MED ORDER — OXYCODONE HCL 5 MG PO TABS: 5.0000 mg | ORAL_TABLET | Freq: Three times a day (TID) | ORAL | 0 refills | Status: AC | PRN

## 2018-07-05 MED ORDER — CEFAZOLIN SODIUM-DEXTROSE 2-4 GM/100ML-% IV SOLN
INTRAVENOUS | Status: AC
  Filled 2018-07-05: qty 100

## 2018-07-05 MED ORDER — GABAPENTIN 300 MG PO CAPS
ORAL_CAPSULE | ORAL | Status: AC
  Administered 2018-07-05: 300 mg via ORAL
  Filled 2018-07-05: qty 1

## 2018-07-05 MED ORDER — ONDANSETRON HCL 4 MG/2ML IJ SOLN
4.0000 mg | Freq: Once | INTRAMUSCULAR | Status: AC | PRN
  Administered 2018-07-05: 16:00:00 4 mg via INTRAVENOUS

## 2018-07-05 MED ORDER — ROCURONIUM BROMIDE 10 MG/ML (PF) SYRINGE
PREFILLED_SYRINGE | INTRAVENOUS | Status: AC
  Filled 2018-07-05: qty 20

## 2018-07-05 MED ORDER — GABAPENTIN 300 MG PO CAPS
300.0000 mg | ORAL_CAPSULE | ORAL | Status: AC
  Administered 2018-07-05: 12:00:00 300 mg via ORAL

## 2018-07-05 MED ORDER — 0.9 % SODIUM CHLORIDE (POUR BTL) OPTIME
TOPICAL | Status: DC | PRN
  Administered 2018-07-05: 1000 mL

## 2018-07-05 MED ORDER — CEFAZOLIN SODIUM-DEXTROSE 2-4 GM/100ML-% IV SOLN
2.0000 g | INTRAVENOUS | Status: AC
  Administered 2018-07-05: 2 g via INTRAVENOUS

## 2018-07-05 MED ORDER — GLYCOPYRROLATE PF 0.2 MG/ML IJ SOSY
PREFILLED_SYRINGE | INTRAMUSCULAR | Status: AC
  Filled 2018-07-05: qty 1

## 2018-07-05 MED ORDER — DEXAMETHASONE SODIUM PHOSPHATE 10 MG/ML IJ SOLN
10.0000 mg | Freq: Once | INTRAMUSCULAR | Status: AC
  Administered 2018-07-05: 10 mg via INTRAVENOUS
  Filled 2018-07-05: qty 1

## 2018-07-05 MED ORDER — PROPOFOL 10 MG/ML IV BOLUS
INTRAVENOUS | Status: DC | PRN
  Administered 2018-07-05: 200 mg via INTRAVENOUS

## 2018-07-05 MED ORDER — LIDOCAINE 2% (20 MG/ML) 5 ML SYRINGE
INTRAMUSCULAR | Status: AC
  Filled 2018-07-05: qty 10

## 2018-07-05 MED ORDER — PHENYLEPHRINE 40 MCG/ML (10ML) SYRINGE FOR IV PUSH (FOR BLOOD PRESSURE SUPPORT)
PREFILLED_SYRINGE | INTRAVENOUS | Status: DC | PRN
  Administered 2018-07-05 (×2): 200 ug via INTRAVENOUS

## 2018-07-05 MED ORDER — ROCURONIUM BROMIDE 10 MG/ML (PF) SYRINGE
PREFILLED_SYRINGE | INTRAVENOUS | Status: DC | PRN
  Administered 2018-07-05: 10 mg via INTRAVENOUS
  Administered 2018-07-05: 50 mg via INTRAVENOUS

## 2018-07-05 MED ORDER — ONDANSETRON HCL 4 MG/2ML IJ SOLN
INTRAMUSCULAR | Status: DC | PRN
  Administered 2018-07-05: 4 mg via INTRAVENOUS

## 2018-07-05 MED ORDER — ACETAMINOPHEN 500 MG PO TABS
1000.0000 mg | ORAL_TABLET | ORAL | Status: AC
  Administered 2018-07-05: 12:00:00 1000 mg via ORAL

## 2018-07-05 SURGICAL SUPPLY — 44 items
APPLIER CLIP 5 13 M/L LIGAMAX5 (MISCELLANEOUS)
BINDER ABD UNIV 12 45-62 (WOUND CARE) ×1 IMPLANT
BINDER ABDOMINAL 46IN 62IN (WOUND CARE) ×2
BLADE CLIPPER SURG (BLADE) IMPLANT
CANISTER SUCT 3000ML PPV (MISCELLANEOUS) IMPLANT
CHLORAPREP W/TINT 26ML (MISCELLANEOUS) ×2 IMPLANT
CLIP APPLIE 5 13 M/L LIGAMAX5 (MISCELLANEOUS) IMPLANT
COVER SURGICAL LIGHT HANDLE (MISCELLANEOUS) ×2 IMPLANT
COVER WAND RF STERILE (DRAPES) ×2 IMPLANT
DERMABOND ADVANCED (GAUZE/BANDAGES/DRESSINGS) ×1
DERMABOND ADVANCED .7 DNX12 (GAUZE/BANDAGES/DRESSINGS) ×1 IMPLANT
DEVICE PMI PUNCTURE CLOSURE (MISCELLANEOUS) ×2 IMPLANT
DEVICE SECURE STRAP 25 ABSORB (INSTRUMENTS) ×2 IMPLANT
DEVICE TROCAR PUNCTURE CLOSURE (ENDOMECHANICALS) IMPLANT
ELECT REM PT RETURN 9FT ADLT (ELECTROSURGICAL) ×2
ELECTRODE REM PT RTRN 9FT ADLT (ELECTROSURGICAL) ×1 IMPLANT
GLOVE BIO SURGEON STRL SZ 6 (GLOVE) ×2 IMPLANT
GLOVE INDICATOR 6.5 STRL GRN (GLOVE) ×2 IMPLANT
GOWN STRL REUS W/ TWL LRG LVL3 (GOWN DISPOSABLE) ×3 IMPLANT
GOWN STRL REUS W/TWL LRG LVL3 (GOWN DISPOSABLE) ×3
KIT BASIN OR (CUSTOM PROCEDURE TRAY) ×2 IMPLANT
KIT TURNOVER KIT B (KITS) ×2 IMPLANT
MARKER SKIN DUAL TIP RULER LAB (MISCELLANEOUS) ×2 IMPLANT
MESH ULTRAPRO 3X6 7.6X15CM (Mesh General) ×2 IMPLANT
NEEDLE SPNL 22GX3.5 QUINCKE BK (NEEDLE) ×2 IMPLANT
NS IRRIG 1000ML POUR BTL (IV SOLUTION) ×2 IMPLANT
PAD ARMBOARD 7.5X6 YLW CONV (MISCELLANEOUS) ×4 IMPLANT
SCISSORS LAP 5X35 DISP (ENDOMECHANICALS) ×2 IMPLANT
SET IRRIG TUBING LAPAROSCOPIC (IRRIGATION / IRRIGATOR) IMPLANT
SET TUBE SMOKE EVAC HIGH FLOW (TUBING) ×2 IMPLANT
SHEARS HARMONIC ACE PLUS 36CM (ENDOMECHANICALS) IMPLANT
SLEEVE ENDOPATH XCEL 5M (ENDOMECHANICALS) ×4 IMPLANT
SUT ETHIBOND 0 MO6 C/R (SUTURE) IMPLANT
SUT ETHIBOND CT1 BRD #0 30IN (SUTURE) IMPLANT
SUT MNCRL AB 4-0 PS2 18 (SUTURE) ×2 IMPLANT
SUT PDS AB 2-0 CT1 27 (SUTURE) IMPLANT
TOWEL OR 17X24 6PK STRL BLUE (TOWEL DISPOSABLE) ×2 IMPLANT
TOWEL OR 17X26 10 PK STRL BLUE (TOWEL DISPOSABLE) ×2 IMPLANT
TRAY FOLEY W/BAG SLVR 14FR (SET/KITS/TRAYS/PACK) ×2 IMPLANT
TRAY LAPAROSCOPIC MC (CUSTOM PROCEDURE TRAY) ×2 IMPLANT
TROCAR XCEL BLUNT TIP 100MML (ENDOMECHANICALS) IMPLANT
TROCAR XCEL NON-BLD 11X100MML (ENDOMECHANICALS) ×4 IMPLANT
TROCAR XCEL NON-BLD 5MMX100MML (ENDOMECHANICALS) ×2 IMPLANT
WATER STERILE IRR 1000ML POUR (IV SOLUTION) ×2 IMPLANT

## 2018-07-05 NOTE — Interval H&P Note (Signed)
History and Physical Interval Note:  07/05/2018 12:42 PM  Shannon Mccullough  has presented today for surgery, with the diagnosis of SPIGELIAN HERNIA.  The various methods of treatment have been discussed with the patient and family. After consideration of risks, benefits and other options for treatment, the patient has consented to  Procedure(s): [place] (N/A) as a surgical intervention.  The patient's history has been reviewed, patient examined, no change in status, stable for surgery.  I have reviewed the patient's chart and labs.  Questions were answered to the patient's satisfaction.     Ayush Boulet Rich Brave

## 2018-07-05 NOTE — Transfer of Care (Signed)
Immediate Anesthesia Transfer of Care Note  Patient: Shannon Mccullough  Procedure(s) Performed: LAPAROSCOPIC ASSISTED SPIGELIAN HERNIA REPAIR WITH MESH (N/A Abdomen)  Patient Location: PACU  Anesthesia Type:General  Level of Consciousness: awake, oriented and patient cooperative  Airway & Oxygen Therapy: Patient Spontanous Breathing and Patient connected to nasal cannula oxygen  Post-op Assessment: Report given to RN and Post -op Vital signs reviewed and stable  Post vital signs: Reviewed  Last Vitals:  Vitals Value Taken Time  BP 122/79 07/05/2018  2:55 PM  Temp    Pulse 97 07/05/2018  2:57 PM  Resp 12 07/05/2018  2:57 PM  SpO2 100 % 07/05/2018  2:57 PM  Vitals shown include unvalidated device data.  Last Pain:  Vitals:   07/05/18 1213  TempSrc:   PainSc: 0-No pain         Complications: No apparent anesthesia complications

## 2018-07-05 NOTE — Anesthesia Postprocedure Evaluation (Signed)
Anesthesia Post Note  Patient: Shannon Mccullough  Procedure(s) Performed: LAPAROSCOPIC ASSISTED SPIGELIAN HERNIA REPAIR WITH MESH (N/A Abdomen)     Patient location during evaluation: Phase II Anesthesia Type: General Level of consciousness: awake Pain management: pain level controlled Vital Signs Assessment: post-procedure vital signs reviewed and stable Respiratory status: spontaneous breathing Cardiovascular status: stable Postop Assessment: no apparent nausea or vomiting Anesthetic complications: no    Last Vitals:  Vitals:   07/05/18 1510 07/05/18 1525  BP: 114/63 116/65  Pulse: 94 83  Resp: 14 10  Temp:    SpO2: 93% 91%    Last Pain:  Vitals:   07/05/18 1525  TempSrc:   PainSc: 0-No pain   Pain Goal:                   Huston Foley

## 2018-07-05 NOTE — Discharge Instructions (Signed)
HERNIA REPAIR: POST OP INSTRUCTIONS  ######################################################################  EAT Gradually transition to a high fiber diet with a fiber supplement over the next few weeks after discharge.  Start with a pureed / full liquid diet (see below)  WALK Walk an hour a day.  Control your pain to do that.    CONTROL PAIN Control pain so that you can walk, sleep, tolerate sneezing/coughing, and go up/down stairs.  HAVE A BOWEL MOVEMENT DAILY Keep your bowels regular to avoid problems.  OK to try a laxative to override constipation.  OK to use an antidairrheal to slow down diarrhea.  Call if not better after 2 tries  CALL IF YOU HAVE PROBLEMS/CONCERNS Call if you are still struggling despite following these instructions. Call if you have concerns not answered by these instructions  ######################################################################    1. DIET: Follow a light bland diet the first 24 hours after arrival home, such as soup, liquids, crackers, etc.  Be sure to include lots of fluids daily.  Advance to a low fat / high fiber diet over the next few days after surgery.  Avoid fast food or heavy meals the first week as your are more likely to get nauseated.    2. Take your usually prescribed home medications unless otherwise directed.  3. PAIN CONTROL: a. Pain is best controlled by a usual combination of three different methods TOGETHER: i. Ice/Heat ii. Over the counter pain medication iii. Prescription pain medication b. Most patients will experience some swelling and bruising around the hernia(s) such as the bellybutton, groins, or old incisions.  Ice packs or heating pads (30-60 minutes up to 6 times a day) will help. Use ice for the first few days to help decrease swelling and bruising, then switch to heat to help relax tight/sore spots and speed recovery.  Some people prefer to use ice alone, heat alone, alternating between ice & heat.  Experiment  to what works for you.  Swelling and bruising can take several weeks to resolve.   c. It is helpful to take an over-the-counter pain medication regularly for the first few weeks.  Choose one of the following that works best for you: i. Naproxen (Aleve, etc)  Two 220mg  tabs twice a day ii. Ibuprofen (Advil, etc) Three 200mg  tabs four times a day (every meal & bedtime) iii. Acetaminophen (Tylenol, etc) 325-650mg  four times a day (every meal & bedtime) d. A  prescription for pain medication should be given to you upon discharge.  Take your pain medication as prescribed, if needed.  i. If you are having problems/concerns with the prescription medicine (does not control pain, nausea, vomiting, rash, itching, etc), please call us [phone] to see if we need to switch you to a different pain medicine that will work better for you and/or control your side effect better. ii. If you need a refill on your pain medication, please contact your pharmacy.  They will contact our office to request authorization. Prescriptions will not be filled after 5 pm or on week-ends.  4. Avoid getting constipated.  Between the surgery and the pain medications, it is common to experience some constipation.  Increasing fluid intake and taking a fiber supplement (such as Metamucil, Citrucel, FiberCon, MiraLax, etc) 1-2 times a day regularly will usually help prevent this problem from occurring.  A mild laxative (prune juice, Milk of Magnesia, MiraLax, etc) should be taken according to package directions if there are no bowel movements after 48 hours.    5. Wash /  shower every day, starting on post-op day 2.  You may shower over the skin glue which is waterproof.    6. Glue will flake off after 2 weeks. You may replace a dressing/Band-Aid to cover the incision for comfort if you wish. You may leave the incisions open to air.  You may replace a dressing/Band-Aid to cover an incision for comfort if you wish.  Continue to shower over  incision(s) after the dressing is off.  7. ACTIVITIES as tolerated:   a. You may resume regular (light) daily activities beginning the next day--such as daily self-care, walking, climbing stairs--gradually increasing activities as tolerated.  Control your pain so that you can walk an hour a day.  If you can walk 30 minutes without difficulty, it is safe to try more intense activity such as jogging, treadmill, bicycling, low-impact aerobics, swimming, etc. b. Refrain the most intensive and strenuous activity such as sit-ups, heavy lifting, contact sports, etc  Refrain from any heavy lifting or straining until 6 weeks after surgery.   c. DO NOT PUSH THROUGH PAIN.  Let pain be your guide: If it hurts to do something, don't do it.  Pain is your body warning you to avoid that activity for another week until the pain goes down. d. You may drive when you are no longer taking prescription pain medication, you can comfortably wear a seatbelt, and you can safely maneuver your car and apply brakes. e. Dennis Bast may have sexual intercourse when it is comfortable.   8. FOLLOW UP in our office a. Please call [facility] at [phone] to set up an appointment to see your surgeon in the office for a follow-up appointment approximately 2-3 weeks after your surgery. b. Make sure that you call for this appointment the day you arrive home to insure a convenient appointment time.  9.  If you have disability of FMLA / Family leave forms, please bring the forms to the office for processing.  (do not give to your surgeon).  WHEN TO CALL us [phone]: 1. Poor pain control 2. Reactions / problems with new medications (rash/itching, nausea, etc)  3. Fever over 101.5 F (38.5 C) 4. Inability to urinate 5. Nausea and/or vomiting 6. Worsening swelling or bruising 7. Continued bleeding from incision. 8. Increased pain, redness, or drainage from the incision   The clinic staff is available to answer your questions during regular  business hours (8:30am-5pm).  Please dont hesitate to call and ask to speak to one of our nurses for clinical concerns.   If you have a medical emergency, go to the nearest emergency room or call 911.  A surgeon from [facility] is always on call at the hospitals in [facility], [place], [place], [place], [place]  [address] ?  [address] [address], [place], [place]   [address] MAIN: [phone] ? TOLL FREE: [phone] ? FAX: [phone] [email or web address]

## 2018-07-05 NOTE — Anesthesia Procedure Notes (Addendum)

## 2018-07-05 NOTE — Op Note (Signed)
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 A foley catheter ws inserted which is removed at the end of the case.  The patient's abdomen was prepped and draped in usual sterile fashion.  Peritoneal access was gained using an Optiview technique in the left upper quadrant.  Insufflation to 15 mmHg ensued without incident and gross inspection demonstrated no evidence of injury from our entry.  There is a solitary omental adhesion to the lower abdominal wall.  There is a small divot of the peritoneum in the right lower quadrant in the expected location of the linea semilunaris which is the location of the patient's symptoms, confirming suspected diagnosis of a spigelian hernia.  Under direct visualization a left lower quadrant 5 mm trocar and a right upper quadrant 11 mm trocar were inserted.  The thin omental adhesion to the abdominal wall was sharply lysed.  Proceeded to develop a flap using cautery and sharp dissection as well as blunt dissection and this began about 5 cm medial to the defect and proceeded until the hernia was reduced. It was noted to contain a lobule of preperitoneal fat.  The defect was just under 1 cm in diameter.  Continued with gentle blunt dissection until adequate space had been prepared for the mesh.  A piece of ultra Pro mesh was  brought onto the field and trimmed to suit the space, then inserted through the 11 mm trocar.  This was then unfurled to cover the hernia defect and was able to do so with about 4 cm of overlap circumferentially.  The mesh was able to sit flush against the anterior abdominal wall in the retro-muscular space.  This was secured medially using the secure strap tacker, no tacks were placed laterally.  The peritoneal flap was then brought back up to cover the mesh ensuring that the mesh did not buckle or fold while doing so.  The flap was re-opposed to the abdominal wall using the secure strap tacker.  On completion there is no exposed mesh.  The remainder of the abdomen was inspected.  There is no inguinal hernia bilaterally.  No other gross abnormalities.  Hemostasis is excellent.  A tap block on the right side was performed using Exparel mixed with Marcaine.  Each of the trocar sites was also infiltrated with a mixture.  The 11 mm trocar site was closed with 2 interrupted 0 Vicryls in the fascia using the laparoscopic suture passer under direct visualization.  The abdomen was then desufflated and all trochars removed.  The skin incisions were closed with subcuticular Monocryl and Dermabond. The patient was then awakened, extubated and taken to PACU in stable condition.   All counts were correct at the completion of the case.

## 2018-07-06 ENCOUNTER — Encounter (HOSPITAL_COMMUNITY): Payer: Self-pay | Admitting: Surgery

## 2018-07-15 DIAGNOSIS — Z20828 Contact with and (suspected) exposure to other viral communicable diseases: Secondary | ICD-10-CM | POA: Diagnosis not present

## 2018-08-09 DIAGNOSIS — S060X0A Concussion without loss of consciousness, initial encounter: Secondary | ICD-10-CM | POA: Diagnosis not present

## 2018-08-20 DIAGNOSIS — Z711 Person with feared health complaint in whom no diagnosis is made: Secondary | ICD-10-CM | POA: Diagnosis not present

## 2018-08-20 DIAGNOSIS — Z20828 Contact with and (suspected) exposure to other viral communicable diseases: Secondary | ICD-10-CM | POA: Diagnosis not present

## 2018-09-20 DIAGNOSIS — L810 Postinflammatory hyperpigmentation: Secondary | ICD-10-CM | POA: Diagnosis not present

## 2018-09-21 DIAGNOSIS — M79674 Pain in right toe(s): Secondary | ICD-10-CM | POA: Diagnosis not present

## 2018-10-11 DIAGNOSIS — R7303 Prediabetes: Secondary | ICD-10-CM | POA: Diagnosis not present

## 2018-10-11 DIAGNOSIS — Z1211 Encounter for screening for malignant neoplasm of colon: Secondary | ICD-10-CM | POA: Diagnosis not present

## 2018-10-11 DIAGNOSIS — E039 Hypothyroidism, unspecified: Secondary | ICD-10-CM | POA: Diagnosis not present

## 2018-10-11 DIAGNOSIS — Z5181 Encounter for therapeutic drug level monitoring: Secondary | ICD-10-CM | POA: Diagnosis not present

## 2018-10-11 DIAGNOSIS — Z23 Encounter for immunization: Secondary | ICD-10-CM | POA: Diagnosis not present

## 2018-10-11 DIAGNOSIS — E785 Hyperlipidemia, unspecified: Secondary | ICD-10-CM | POA: Diagnosis not present

## 2018-10-18 DIAGNOSIS — H16142 Punctate keratitis, left eye: Secondary | ICD-10-CM | POA: Diagnosis not present

## 2018-10-18 DIAGNOSIS — H1132 Conjunctival hemorrhage, left eye: Secondary | ICD-10-CM | POA: Diagnosis not present

## 2018-11-02 DIAGNOSIS — Z1231 Encounter for screening mammogram for malignant neoplasm of breast: Secondary | ICD-10-CM | POA: Diagnosis not present

## 2018-11-02 DIAGNOSIS — Z6826 Body mass index (BMI) 26.0-26.9, adult: Secondary | ICD-10-CM | POA: Diagnosis not present

## 2018-11-02 DIAGNOSIS — Z01419 Encounter for gynecological examination (general) (routine) without abnormal findings: Secondary | ICD-10-CM | POA: Diagnosis not present

## 2018-11-14 DIAGNOSIS — D485 Neoplasm of uncertain behavior of skin: Secondary | ICD-10-CM | POA: Diagnosis not present

## 2018-11-14 DIAGNOSIS — D1801 Hemangioma of skin and subcutaneous tissue: Secondary | ICD-10-CM | POA: Diagnosis not present

## 2018-11-14 DIAGNOSIS — D2262 Melanocytic nevi of left upper limb, including shoulder: Secondary | ICD-10-CM | POA: Diagnosis not present

## 2018-11-14 DIAGNOSIS — D1779 Benign lipomatous neoplasm of other sites: Secondary | ICD-10-CM | POA: Diagnosis not present

## 2018-11-14 DIAGNOSIS — Z23 Encounter for immunization: Secondary | ICD-10-CM | POA: Diagnosis not present

## 2018-12-04 DIAGNOSIS — Z119 Encounter for screening for infectious and parasitic diseases, unspecified: Secondary | ICD-10-CM | POA: Diagnosis not present

## 2018-12-11 DIAGNOSIS — Z119 Encounter for screening for infectious and parasitic diseases, unspecified: Secondary | ICD-10-CM | POA: Diagnosis not present

## 2018-12-16 DIAGNOSIS — Z119 Encounter for screening for infectious and parasitic diseases, unspecified: Secondary | ICD-10-CM | POA: Diagnosis not present

## 2018-12-25 DIAGNOSIS — Z119 Encounter for screening for infectious and parasitic diseases, unspecified: Secondary | ICD-10-CM | POA: Diagnosis not present

## 2019-01-08 DIAGNOSIS — Z119 Encounter for screening for infectious and parasitic diseases, unspecified: Secondary | ICD-10-CM | POA: Diagnosis not present

## 2019-01-22 DIAGNOSIS — Z119 Encounter for screening for infectious and parasitic diseases, unspecified: Secondary | ICD-10-CM | POA: Diagnosis not present

## 2019-01-29 DIAGNOSIS — Z119 Encounter for screening for infectious and parasitic diseases, unspecified: Secondary | ICD-10-CM | POA: Diagnosis not present

## 2019-02-05 DIAGNOSIS — Z119 Encounter for screening for infectious and parasitic diseases, unspecified: Secondary | ICD-10-CM | POA: Diagnosis not present

## 2019-02-06 ENCOUNTER — Ambulatory Visit: Payer: BC Managed Care – PPO | Attending: Internal Medicine

## 2019-02-06 DIAGNOSIS — Z23 Encounter for immunization: Secondary | ICD-10-CM | POA: Diagnosis not present

## 2019-02-06 NOTE — Progress Notes (Signed)
   Covid-19 Vaccination Clinic  Name:  Shannon Mccullough    MRN: [id number] DOB: [birth year]  02/06/2019  Shannon Mccullough was observed post Covid-19 immunization for 15 minutes without incidence. She was provided with Vaccine Information Sheet and instruction to access the V-Safe system.   Shannon Mccullough was instructed to call 911 with any severe reactions post vaccine: Marland Kitchen Difficulty breathing  . Swelling of your face and throat  . A fast heartbeat  . A bad rash all over your body  . Dizziness and weakness    Immunizations Administered    Name Date Dose VIS Date Route   Pfizer COVID-19 Vaccine 02/06/2019  9:29 AM 0.3 mL 01/05/2019 Intramuscular   Manufacturer: [employer], [employer]   Lot: [id number]   NDC: [address]-3

## 2019-02-08 DIAGNOSIS — Z119 Encounter for screening for infectious and parasitic diseases, unspecified: Secondary | ICD-10-CM | POA: Diagnosis not present

## 2019-02-12 DIAGNOSIS — Z119 Encounter for screening for infectious and parasitic diseases, unspecified: Secondary | ICD-10-CM | POA: Diagnosis not present

## 2019-02-19 DIAGNOSIS — Z119 Encounter for screening for infectious and parasitic diseases, unspecified: Secondary | ICD-10-CM | POA: Diagnosis not present

## 2019-02-22 DIAGNOSIS — Z119 Encounter for screening for infectious and parasitic diseases, unspecified: Secondary | ICD-10-CM | POA: Diagnosis not present

## 2019-02-26 ENCOUNTER — Ambulatory Visit: Payer: BC Managed Care – PPO | Attending: Internal Medicine

## 2019-02-26 DIAGNOSIS — Z23 Encounter for immunization: Secondary | ICD-10-CM | POA: Insufficient documentation

## 2019-02-26 DIAGNOSIS — Z119 Encounter for screening for infectious and parasitic diseases, unspecified: Secondary | ICD-10-CM | POA: Diagnosis not present

## 2019-02-26 NOTE — Progress Notes (Signed)
   Covid-19 Vaccination Clinic  Name:  Shannon Mccullough    MRN: [id number] DOB: 12-19-1965  02/26/2019  [name] was observed post Covid-19 immunization for 15 minutes without incidence. She was provided with Vaccine Information Sheet and instruction to access the V-Safe system.   [name] was instructed to call 911 with any severe reactions post vaccine: Marland Kitchen Difficulty breathing  . Swelling of your face and throat  . A fast heartbeat  . A bad rash all over your body  . Dizziness and weakness    Immunizations Administered    Name Date Dose VIS Date Route   Pfizer COVID-19 Vaccine 02/26/2019  8:39 AM 0.3 mL 01/05/2019 Intramuscular   Manufacturer: [employer], [employer]   Lot: [id number]   NDC: [address]-0

## 2019-03-05 DIAGNOSIS — Z119 Encounter for screening for infectious and parasitic diseases, unspecified: Secondary | ICD-10-CM | POA: Diagnosis not present

## 2019-03-08 DIAGNOSIS — Z20828 Contact with and (suspected) exposure to other viral communicable diseases: Secondary | ICD-10-CM | POA: Diagnosis not present

## 2019-03-08 DIAGNOSIS — Z1159 Encounter for screening for other viral diseases: Secondary | ICD-10-CM | POA: Diagnosis not present

## 2019-03-12 DIAGNOSIS — Z20828 Contact with and (suspected) exposure to other viral communicable diseases: Secondary | ICD-10-CM | POA: Diagnosis not present

## 2019-03-12 DIAGNOSIS — Z1159 Encounter for screening for other viral diseases: Secondary | ICD-10-CM | POA: Diagnosis not present

## 2019-03-26 DIAGNOSIS — Z1159 Encounter for screening for other viral diseases: Secondary | ICD-10-CM | POA: Diagnosis not present

## 2019-03-26 DIAGNOSIS — Z20828 Contact with and (suspected) exposure to other viral communicable diseases: Secondary | ICD-10-CM | POA: Diagnosis not present

## 2019-03-29 DIAGNOSIS — Z1159 Encounter for screening for other viral diseases: Secondary | ICD-10-CM | POA: Diagnosis not present

## 2019-03-29 DIAGNOSIS — Z20828 Contact with and (suspected) exposure to other viral communicable diseases: Secondary | ICD-10-CM | POA: Diagnosis not present

## 2019-04-02 DIAGNOSIS — Z20828 Contact with and (suspected) exposure to other viral communicable diseases: Secondary | ICD-10-CM | POA: Diagnosis not present

## 2019-04-02 DIAGNOSIS — Z1159 Encounter for screening for other viral diseases: Secondary | ICD-10-CM | POA: Diagnosis not present

## 2019-04-05 DIAGNOSIS — Z1159 Encounter for screening for other viral diseases: Secondary | ICD-10-CM | POA: Diagnosis not present

## 2019-04-05 DIAGNOSIS — Z20828 Contact with and (suspected) exposure to other viral communicable diseases: Secondary | ICD-10-CM | POA: Diagnosis not present

## 2019-04-16 DIAGNOSIS — Z1159 Encounter for screening for other viral diseases: Secondary | ICD-10-CM | POA: Diagnosis not present

## 2019-04-16 DIAGNOSIS — Z20828 Contact with and (suspected) exposure to other viral communicable diseases: Secondary | ICD-10-CM | POA: Diagnosis not present

## 2019-06-15 DIAGNOSIS — M62830 Muscle spasm of back: Secondary | ICD-10-CM | POA: Diagnosis not present

## 2019-07-14 DIAGNOSIS — N390 Urinary tract infection, site not specified: Secondary | ICD-10-CM | POA: Diagnosis not present

## 2019-07-14 DIAGNOSIS — N3001 Acute cystitis with hematuria: Secondary | ICD-10-CM | POA: Diagnosis not present

## 2019-09-04 DIAGNOSIS — Z20822 Contact with and (suspected) exposure to covid-19: Secondary | ICD-10-CM | POA: Diagnosis not present

## 2019-09-17 DIAGNOSIS — Z03818 Encounter for observation for suspected exposure to other biological agents ruled out: Secondary | ICD-10-CM | POA: Diagnosis not present

## 2019-09-17 DIAGNOSIS — Z1152 Encounter for screening for COVID-19: Secondary | ICD-10-CM | POA: Diagnosis not present

## 2019-10-18 DIAGNOSIS — E039 Hypothyroidism, unspecified: Secondary | ICD-10-CM | POA: Diagnosis not present

## 2019-10-18 DIAGNOSIS — Z0000 Encounter for general adult medical examination without abnormal findings: Secondary | ICD-10-CM | POA: Diagnosis not present

## 2019-10-18 DIAGNOSIS — E785 Hyperlipidemia, unspecified: Secondary | ICD-10-CM | POA: Diagnosis not present

## 2019-10-18 DIAGNOSIS — Z5181 Encounter for therapeutic drug level monitoring: Secondary | ICD-10-CM | POA: Diagnosis not present

## 2019-10-18 DIAGNOSIS — Z23 Encounter for immunization: Secondary | ICD-10-CM | POA: Diagnosis not present

## 2019-10-18 DIAGNOSIS — R7303 Prediabetes: Secondary | ICD-10-CM | POA: Diagnosis not present

## 2019-11-06 DIAGNOSIS — M1712 Unilateral primary osteoarthritis, left knee: Secondary | ICD-10-CM | POA: Diagnosis not present

## 2019-11-08 DIAGNOSIS — M1712 Unilateral primary osteoarthritis, left knee: Secondary | ICD-10-CM | POA: Diagnosis not present

## 2019-11-09 DIAGNOSIS — H17823 Peripheral opacity of cornea, bilateral: Secondary | ICD-10-CM | POA: Diagnosis not present

## 2019-11-10 DIAGNOSIS — Z20822 Contact with and (suspected) exposure to covid-19: Secondary | ICD-10-CM | POA: Diagnosis not present

## 2019-11-14 DIAGNOSIS — L821 Other seborrheic keratosis: Secondary | ICD-10-CM | POA: Diagnosis not present

## 2019-11-14 DIAGNOSIS — L578 Other skin changes due to chronic exposure to nonionizing radiation: Secondary | ICD-10-CM | POA: Diagnosis not present

## 2019-11-14 DIAGNOSIS — D1779 Benign lipomatous neoplasm of other sites: Secondary | ICD-10-CM | POA: Diagnosis not present

## 2019-11-14 DIAGNOSIS — L910 Hypertrophic scar: Secondary | ICD-10-CM | POA: Diagnosis not present

## 2019-12-13 DIAGNOSIS — H43391 Other vitreous opacities, right eye: Secondary | ICD-10-CM | POA: Diagnosis not present

## 2019-12-29 DIAGNOSIS — Z03818 Encounter for observation for suspected exposure to other biological agents ruled out: Secondary | ICD-10-CM | POA: Diagnosis not present

## 2019-12-29 DIAGNOSIS — J00 Acute nasopharyngitis [common cold]: Secondary | ICD-10-CM | POA: Diagnosis not present

## 2019-12-31 DIAGNOSIS — Z20822 Contact with and (suspected) exposure to covid-19: Secondary | ICD-10-CM | POA: Diagnosis not present

## 2019-12-31 DIAGNOSIS — Z03818 Encounter for observation for suspected exposure to other biological agents ruled out: Secondary | ICD-10-CM | POA: Diagnosis not present

## 2020-01-07 DIAGNOSIS — M1712 Unilateral primary osteoarthritis, left knee: Secondary | ICD-10-CM | POA: Diagnosis not present

## 2020-01-15 DIAGNOSIS — Z01419 Encounter for gynecological examination (general) (routine) without abnormal findings: Secondary | ICD-10-CM | POA: Diagnosis not present

## 2020-01-15 DIAGNOSIS — Z1231 Encounter for screening mammogram for malignant neoplasm of breast: Secondary | ICD-10-CM | POA: Diagnosis not present

## 2020-01-15 DIAGNOSIS — Z6826 Body mass index (BMI) 26.0-26.9, adult: Secondary | ICD-10-CM | POA: Diagnosis not present

## 2020-02-07 DIAGNOSIS — Z1159 Encounter for screening for other viral diseases: Secondary | ICD-10-CM | POA: Diagnosis not present

## 2020-02-07 DIAGNOSIS — Z20828 Contact with and (suspected) exposure to other viral communicable diseases: Secondary | ICD-10-CM | POA: Diagnosis not present

## 2020-02-11 DIAGNOSIS — Z20828 Contact with and (suspected) exposure to other viral communicable diseases: Secondary | ICD-10-CM | POA: Diagnosis not present

## 2020-02-11 DIAGNOSIS — Z1159 Encounter for screening for other viral diseases: Secondary | ICD-10-CM | POA: Diagnosis not present

## 2020-02-13 DIAGNOSIS — M1712 Unilateral primary osteoarthritis, left knee: Secondary | ICD-10-CM | POA: Diagnosis not present

## 2020-02-14 DIAGNOSIS — Z20828 Contact with and (suspected) exposure to other viral communicable diseases: Secondary | ICD-10-CM | POA: Diagnosis not present

## 2020-02-14 DIAGNOSIS — Z1159 Encounter for screening for other viral diseases: Secondary | ICD-10-CM | POA: Diagnosis not present

## 2020-02-21 DIAGNOSIS — Z1159 Encounter for screening for other viral diseases: Secondary | ICD-10-CM | POA: Diagnosis not present

## 2020-02-21 DIAGNOSIS — Z20828 Contact with and (suspected) exposure to other viral communicable diseases: Secondary | ICD-10-CM | POA: Diagnosis not present

## 2020-02-25 DIAGNOSIS — M25562 Pain in left knee: Secondary | ICD-10-CM | POA: Diagnosis not present

## 2020-02-25 DIAGNOSIS — M1712 Unilateral primary osteoarthritis, left knee: Secondary | ICD-10-CM | POA: Diagnosis not present

## 2020-02-25 DIAGNOSIS — M25662 Stiffness of left knee, not elsewhere classified: Secondary | ICD-10-CM | POA: Diagnosis not present

## 2020-03-06 DIAGNOSIS — Z1159 Encounter for screening for other viral diseases: Secondary | ICD-10-CM | POA: Diagnosis not present

## 2020-03-06 DIAGNOSIS — Z20828 Contact with and (suspected) exposure to other viral communicable diseases: Secondary | ICD-10-CM | POA: Diagnosis not present

## 2020-03-24 DIAGNOSIS — Z20828 Contact with and (suspected) exposure to other viral communicable diseases: Secondary | ICD-10-CM | POA: Diagnosis not present

## 2020-03-24 DIAGNOSIS — Z1159 Encounter for screening for other viral diseases: Secondary | ICD-10-CM | POA: Diagnosis not present

## 2020-03-27 DIAGNOSIS — R7303 Prediabetes: Secondary | ICD-10-CM | POA: Diagnosis not present

## 2020-03-27 DIAGNOSIS — Z5181 Encounter for therapeutic drug level monitoring: Secondary | ICD-10-CM | POA: Diagnosis not present

## 2020-03-27 DIAGNOSIS — E039 Hypothyroidism, unspecified: Secondary | ICD-10-CM | POA: Diagnosis not present

## 2020-03-27 DIAGNOSIS — E785 Hyperlipidemia, unspecified: Secondary | ICD-10-CM | POA: Diagnosis not present

## 2020-03-27 DIAGNOSIS — Z01818 Encounter for other preprocedural examination: Secondary | ICD-10-CM | POA: Diagnosis not present

## 2020-04-08 DIAGNOSIS — M1712 Unilateral primary osteoarthritis, left knee: Secondary | ICD-10-CM | POA: Diagnosis not present

## 2020-05-05 ENCOUNTER — Encounter: Payer: Self-pay | Admitting: Internal Medicine

## 2020-05-05 ENCOUNTER — Ambulatory Visit (INDEPENDENT_AMBULATORY_CARE_PROVIDER_SITE_OTHER): Payer: BC Managed Care – PPO | Admitting: Internal Medicine

## 2020-05-05 ENCOUNTER — Other Ambulatory Visit: Payer: Self-pay

## 2020-05-05 VITALS — BP 120/80 | HR 93 | Ht 63.0 in | Wt 146.6 lb

## 2020-05-05 DIAGNOSIS — E039 Hypothyroidism, unspecified: Secondary | ICD-10-CM

## 2020-05-05 LAB — T4, FREE: Free T4: 0.97 ng/dL (ref 0.60–1.60)

## 2020-05-05 LAB — T3, FREE: T3, Free: 2.7 pg/mL (ref 2.3–4.2)

## 2020-05-05 LAB — TSH: TSH: 4.84 u[IU]/mL — ABNORMAL HIGH (ref 0.35–4.50)

## 2020-05-05 NOTE — Progress Notes (Signed)
Patient ID: Shannon Mccullough, female   DOB: 04/13/1965, 55 y.o.   MRN: [id number]   This visit occurred during the SARS-CoV-2 public health emergency.  Safety protocols were in place, including screening questions prior to the visit, additional usage of staff PPE, and extensive cleaning of exam room while observing appropriate contact time as indicated for disinfecting solutions.   HPI  Shannon Mccullough is a 55 y.o.-year-old female, referred by her PCP, Dr. Hyman Hopes, for management of acquired hypothyroidism.  Pt. has been dx with hypothyroidism ~10 years ago >> on Synthroid d.a.w. 75 mcg. Her TFTs were to fluctuating on generic LT4.  She takes the thyroid hormone: - fasting - with water - separated by >30 min from b'fast  - stopped calcium, multivitamins-start before her knee surgery 4 weeks ago.  Previously taken at night. - no iron, PPIs  I reviewed pt's thyroid tests: 10/18/2019: TSH 4.45 No results found for: TSH, FREET4, T3FREE  Antithyroid antibodies: No results found for: THGAB No components found for: TPOAB  Pt describes: - + weight gain - + fatigue  - + cold intolerance (cold extremities) - no depression, + anxiety - + constipation - + palpitations off and on (esp. With caffeine, insomnia) - + hair loss  Pt denies feeling nodules in neck, hoarseness, dysphagia/odynophagia, SOB with lying down.  She has + FH of thyroid disorders in: M, M aunt, MGM, daughter. No FH of thyroid cancer. Daughter with DM1. No h/o radiation tx to head or neck. No recent use of iodine supplements.  Pt. also has a history of prediabetes (gestational diabetes x2; latest HbA1c on 10/18/2019: 6.2%), hyperlipidemia situational anxiety, migraines, recurrent UTIs, history of PVCs. She had L TKR 57 weeks ago.  ROS: Constitutional: + See HPI Eyes: no blurry vision, no xerophthalmia ENT: no sore throat, + see HPI, + tinnitus, + hypoacusis Cardiovascular: no CP/SOB/+ palpitations/no leg swelling Respiratory:  no cough/SOB Gastrointestinal: no N/V/D/+ C Musculoskeletal: no muscle/+ joint aches Skin: no rashes, + hair loss Neurological: no tremors/numbness/tingling/dizziness, + headaches Psychiatric: no depression/+ anxiety + Low libido  Past Medical History:  Diagnosis Date  . Anxiety   . Arthritis   . Asthma   . Deviated septum   . Dysrhythmia    pvc  occ  . Headache   . History of hiatal hernia   . Hypothyroidism   . PONV (postoperative nausea and vomiting)    Past Surgical History:  Procedure Laterality Date  . CARPAL TUNNEL RELEASE    . CESAREAN SECTION    . JOINT REPLACEMENT     x3 scopes  . KNEE ARTHROSCOPY    . LAPAROSCOPIC ASSISTED SPIGELIAN HERNIA REPAIR N/A 07/05/2018   Procedure: LAPAROSCOPIC ASSISTED SPIGELIAN HERNIA REPAIR WITH MESH;  Surgeon: Berna Bue, MD;  Location: [facility] OR;  Service: General;  Laterality: N/A;   Social History   Socioeconomic History  . Marital status: Married    Spouse name: Not on file  . Number of children: 2  . Years of education: Not on file  . Highest education level: Not on file  Occupational History  . Occupation: Contractor  Tobacco Use  . Smoking status: Never Smoker  . Smokeless tobacco: Never Used  Substance and Sexual Activity  . Alcohol use: Yes    Alcohol/week: 0.0 standard drinks    Comment: Beer, mixed drinks 2-3 once a week  . Drug use: Never  . Sexual activity: Yes    Birth control/protection: None    Comment: Menopause  Other Topics Concern  . Not on file  Social History Narrative  . Not on file   Social Determinants of Health   Financial Resource Strain: Not on file  Food Insecurity: Not on file  Transportation Needs: Not on file  Physical Activity: Not on file  Stress: Not on file  Social Connections: Not on file  Intimate Partner Violence: Not on file   Current Outpatient Medications on File Prior to Visit  Medication Sig Dispense Refill  . acetaminophen (TYLENOL) 500 MG  tablet Take 1,000 mg by mouth every 6 (six) hours as needed for mild pain or headache.    . cetirizine (ZYRTEC) 10 MG tablet Take 10 mg by mouth at bedtime.     . docusate sodium (COLACE) 100 MG capsule Take 100 mg by mouth at bedtime.    . fluticasone (FLONASE) 50 MCG/ACT nasal spray Place 1 spray into both nostrils See admin instructions. Take 1 spray in each nostril twice a day during allergy season only.    Marland Kitchen guaiFENesin (MUCINEX) 600 MG 12 hr tablet Take 600 mg by mouth at bedtime.     Marland Kitchen levothyroxine (SYNTHROID) 75 MCG tablet Take 75 mcg by mouth daily before breakfast.    . methocarbamol (ROBAXIN) 500 MG tablet methocarbamol 500 mg tablet    . oxyCODONE (OXY IR/ROXICODONE) 5 MG immediate release tablet oxycodone 5 mg tablet    . traMADol (ULTRAM) 50 MG tablet tramadol 50 mg tablet    . ascorbic acid (VITAMIN C) 500 MG tablet Vitamin C 500 mg tablet (Patient not taking: Reported on 05/05/2020)    . CALCIUM-VITAMIN D PO Take 1 tablet by mouth at bedtime.  (Patient not taking: Reported on 05/05/2020)    . Multiple Vitamin (MULTIVITAMIN WITH MINERALS) TABS tablet Take 1 tablet by mouth at bedtime.  (Patient not taking: Reported on 05/05/2020)     No current facility-administered medications on file prior to visit.   Allergies  Allergen Reactions  . Sulfa Antibiotics Hives  . Latex Rash   Family History  Problem Relation Age of Onset  . Cancer Other   . Hyperlipidemia Other   . Hypertension Other   . Diabetes Other     PE: BP 120/80 (BP Location: Right Arm, Patient Position: Sitting, Cuff Size: Normal)   Pulse 93   Ht 5\' 3"  (1.6 m)   Wt 146 lb 9.6 oz (66.5 kg)   LMP 06/02/2012   SpO2 98%   BMI 25.97 kg/m  Wt Readings from Last 3 Encounters:  05/05/20 146 lb 9.6 oz (66.5 kg)  07/05/18 142 lb 14.4 oz (64.8 kg)  07/04/18 142 lb 14.4 oz (64.8 kg)   Constitutional: normal weight, in NAD Eyes: PERRLA, EOMI, no exophthalmos ENT: moist mucous membranes, no thyromegaly, no cervical  lymphadenopathy Cardiovascular: No tachycardia at the time of the exam, RR, No MRG Respiratory: CTA B Gastrointestinal: abdomen soft, NT, ND, BS+ Musculoskeletal: no deformities, strength intact in all 4 Skin: moist, warm, no rashes Neurological: no tremor with outstretched hands, DTR normal in all 4  ASSESSMENT: 1. Hypothyroidism  PLAN:  1. Patient with long-standing hypothyroidism, on levothyroxine therapy.  She dissatisfaction with the treatment as she continues to have fatigue, cold intolerance, hair loss, anxiety, and weight gain.  - she does not appear to have a goiter, thyroid nodules, or neck compression symptoms - latest thyroid labs reviewed with pt >> normal in 09/2019, although close to the upper limit of normal - she continues on Synthroid d.a.w. 75  mcg daily - we discussed about checking her TFTs today and see if she needs an increase in dose.  If the tests are normal, due to her ongoing symptoms, we discussed about the possibility of adding a desiccated thyroid extract. We discussed about positive and negative aspects of using Armour thyroid. I underlined the fact that:  Armour is purified from porcine thyroid glands Also, the ratio between T4 and T3 in Armour is physiologic for pigs, not for humans.  The short half life of T3 can cause fluctuations in blood levels, which can result in mood swings and heart rhythm abnormalities.  She does have a history of PVCs. -If we do end up switching to Armour Thyroid, I suggested to do this after her knee surgery heals.  She is on pain medicines for now and may need another intervention. - we discussed about taking the thyroid hormone every day, with water, >30 minutes before breakfast, separated by >4 hours from acid reflux medications, calcium, iron, multivitamins. Pt. is taking it correctly. - will check thyroid tests today: TSH, free T3 and fT4.  We will also add antithyroid antibodies to screen her for Hashimoto's thyroiditis.  We  discussed that Hashimoto's thyroiditis is an autoimmune disease and the most common cause of hypothyroidism in [place].  Discussed about possible factors that improve the immune system and therefore the Hashimoto's thyroiditis including rest, exercise, good diet. - If labs are abnormal, she will need to return for repeat TFTs in 1.5 months -I will see her back in 4 months.  Component     Latest Ref Rng & Units 05/05/2020  TSH     0.35 - 4.50 uIU/mL 4.84 (H)  T4,Free(Direct)     0.60 - 1.60 ng/dL 9.56  Triiodothyronine,Free,Serum     2.3 - 4.2 pg/mL 2.7  Thyroglobulin Ab     < or = 1 IU/mL <1  Thyroperoxidase Ab SerPl-aCnc     <9 IU/mL 7  No evidence of Hashimoto's thyroiditis, however, the TSH is above target.  I would suggest to increase the Synthroid dose to 88 mcg daily and recheck her TFTs in 1.5 months.  Carlus Pavlov, MD PhD [facility]

## 2020-05-05 NOTE — Patient Instructions (Addendum)
Please continue Synthroid 75 mcg daily.  Take the thyroid hormone every day, with water, at least 30 minutes before breakfast, separated by at least 4 hours from: - acid reflux medications - calcium - iron - multivitamins  Please stop at the lab.  Please return in 4 months.

## 2020-05-06 LAB — THYROID PEROXIDASE ANTIBODY: Thyroperoxidase Ab SerPl-aCnc: 7 IU/mL (ref <9)

## 2020-05-06 LAB — THYROGLOBULIN ANTIBODY: Thyroglobulin Ab: <1 IU/mL (ref <=1)

## 2020-05-06 MED ORDER — SYNTHROID 88 MCG PO TABS: 88.0000 ug | ORAL_TABLET | Freq: Every day | ORAL | 3 refills | Status: DC

## 2020-05-23 DIAGNOSIS — Z20822 Contact with and (suspected) exposure to covid-19: Secondary | ICD-10-CM | POA: Diagnosis not present

## 2020-05-27 DIAGNOSIS — U071 COVID-19: Secondary | ICD-10-CM | POA: Diagnosis not present

## 2020-05-30 ENCOUNTER — Encounter: Payer: Self-pay | Admitting: Internal Medicine

## 2020-08-01 ENCOUNTER — Encounter: Payer: Self-pay | Admitting: Internal Medicine

## 2020-08-01 ENCOUNTER — Other Ambulatory Visit: Payer: Self-pay

## 2020-08-01 ENCOUNTER — Other Ambulatory Visit (INDEPENDENT_AMBULATORY_CARE_PROVIDER_SITE_OTHER): Payer: BC Managed Care – PPO

## 2020-08-01 DIAGNOSIS — E039 Hypothyroidism, unspecified: Secondary | ICD-10-CM

## 2020-08-01 LAB — TSH: TSH: 1.24 u[IU]/mL (ref 0.35–5.50)

## 2020-08-01 LAB — T4, FREE: Free T4: 1.01 ng/dL (ref 0.60–1.60)

## 2020-08-01 MED ORDER — SYNTHROID 88 MCG PO TABS: 88.0000 ug | ORAL_TABLET | Freq: Every day | ORAL | 0 refills | Status: DC

## 2020-08-05 ENCOUNTER — Telehealth: Payer: Self-pay | Admitting: Internal Medicine

## 2020-08-05 NOTE — Telephone Encounter (Signed)
Referred to patient advocate Kathie Rhodes to initiate PA

## 2020-08-05 NOTE — Telephone Encounter (Signed)
Synthroid medication is not covered, however levothyroxine is covered. [facility] is requesting a new prescription for what is covered.   Callback # [phone] Reference # [id number] [facility] - [place], [place] - [address] Phone:  [phone]  Fax:  [phone]

## 2020-08-05 NOTE — Telephone Encounter (Signed)
Let's do a PA for Synthroid for her-she had increased thyroiditis variability when she was using generic.  She also prefers to be on brand-name Synthroid.

## 2020-08-06 ENCOUNTER — Telehealth: Payer: Self-pay | Admitting: Pharmacy Technician

## 2020-08-06 DIAGNOSIS — E039 Hypothyroidism, unspecified: Secondary | ICD-10-CM

## 2020-08-06 NOTE — Telephone Encounter (Addendum)
Patient Advocate Encounter   Received notification from [facility] that prior authorization for Shannon Mccullough is required.   PA submitted on 08/06/2020 Key [id number] Status is DENIED    [facility] will continue to follow.   Netty Starring. Dimas Aguas, CPhT Patient Advocate [facility] Phone: [phone] Fax:  [phone]

## 2020-08-06 NOTE — Telephone Encounter (Addendum)
Called the patient's plan and they will pay for the generic version of Synthroid.    The prescription was written as DAW.    If rewritten to allow for the generic, it will go through at the pharmacy.  Netty Starring. Dimas Aguas, CPhT Patient Advocate [facility] Phone: [phone] Fax:  [phone]

## 2020-08-13 MED ORDER — LEVOTHYROXINE SODIUM 88 MCG PO TABS: 88.0000 ug | ORAL_TABLET | Freq: Every day | ORAL | 0 refills | Status: DC

## 2020-08-13 NOTE — Addendum Note (Signed)
Addended by: Kenyon Ana on: 08/13/2020 03:23 PM   Modules accepted: Orders

## 2020-08-14 DIAGNOSIS — N3000 Acute cystitis without hematuria: Secondary | ICD-10-CM | POA: Diagnosis not present

## 2020-08-18 NOTE — Telephone Encounter (Signed)
Patient Advocate Encounter   Called in additional appeal.  Plan will fax determination.    [facility] will continue to follow.   Netty Starring. Dimas Aguas, CPhT Patient Advocate [facility] Phone: [phone] Fax:  [phone]

## 2020-08-26 ENCOUNTER — Telehealth: Payer: Self-pay | Admitting: Internal Medicine

## 2020-08-26 DIAGNOSIS — E039 Hypothyroidism, unspecified: Secondary | ICD-10-CM

## 2020-08-26 NOTE — Telephone Encounter (Signed)
[facility] uses brand [facility] so if they use the generic they still get the brand synthroid for 0.00 copay instead of 60.00. please advise. [id number] ref #  [id number].

## 2020-08-26 NOTE — Telephone Encounter (Signed)
Called and spoke with pharmacist to correct rx.

## 2020-09-03 NOTE — Telephone Encounter (Signed)
[facility] Patient Advocate Encounter  Prior Authorization APPEAL for Synthroid 88 mcg has been approved.    Effective dates: 08/18/2020 through 08/18/2021    [facility] will continue to follow.   Jeannette How, CPhT Patient Advocate [facility] Phone: [phone] Fax:  [phone]

## 2020-09-18 ENCOUNTER — Encounter: Payer: Self-pay | Admitting: Internal Medicine

## 2020-09-18 ENCOUNTER — Other Ambulatory Visit: Payer: Self-pay

## 2020-09-18 ENCOUNTER — Ambulatory Visit (INDEPENDENT_AMBULATORY_CARE_PROVIDER_SITE_OTHER): Payer: BC Managed Care – PPO | Admitting: Internal Medicine

## 2020-09-18 VITALS — BP 120/78 | HR 76 | Ht 63.0 in | Wt 146.6 lb

## 2020-09-18 DIAGNOSIS — E039 Hypothyroidism, unspecified: Secondary | ICD-10-CM | POA: Diagnosis not present

## 2020-09-18 LAB — T4, FREE: Free T4: 0.93 ng/dL (ref 0.60–1.60)

## 2020-09-18 LAB — TSH: TSH: 1.13 u[IU]/mL (ref 0.35–5.50)

## 2020-09-18 MED ORDER — SYNTHROID 88 MCG PO TABS: 88.0000 ug | ORAL_TABLET | Freq: Every day | ORAL | 3 refills | Status: DC

## 2020-09-18 NOTE — Patient Instructions (Signed)
Please continue Synthroid 88 mcg daily.  Take the thyroid hormone every day, with water, at least 30 minutes before breakfast, separated by at least 4 hours from: - acid reflux medications - calcium - iron - multivitamins  Please stop at the lab.  Please return in 6 months.

## 2020-09-18 NOTE — Progress Notes (Signed)
Patient ID: Lorijean Husser, female   DOB: [birth year], 55 y.o.   MRN: [id number]   This visit occurred during the SARS-CoV-2 public health emergency.  Safety protocols were in place, including screening questions prior to the visit, additional usage of staff PPE, and extensive cleaning of exam room while observing appropriate contact time as indicated for disinfecting solutions.   HPI  Dala Tokarski is a 55 y.o.-year-old female, initially referred by her PCP, Dr. Hyman Hopes, returning for follow-up for acquired hypothyroidism.  Last visit 4 months ago.  Pt. has been dx with hypothyroidism ~10 years ago >> on Synthroid d.a.w. 75 mcg >> increased to 88 mcg Her TFTs were too fluctuating on generic LT4.  She takes the thyroid hormone: - fasting - with water - separated by >30 min from b'fast  - restarted calcium, multivitamins >> both at night - no iron, PPIs  I reviewed pt's thyroid tests: Lab Results  Component Value Date   TSH 1.24 08/01/2020   TSH 4.84 (H) 05/05/2020   FREET4 1.01 08/01/2020   FREET4 0.97 05/05/2020   T3FREE 2.7 05/05/2020  10/18/2019: TSH 4.45  Antithyroid antibodies were not elevated: Component     Latest Ref Rng & Units 05/05/2020  Thyroglobulin Ab     < or = 1 IU/mL <1  Thyroperoxidase Ab SerPl-aCnc     <9 IU/mL 7   At last visit, she described: - + weight gain - + fatigue  - + cold intolerance (cold extremities) - no depression, + anxiety - + constipation - + palpitations off and on (esp. With caffeine, insomnia) - + hair loss  Now hot flushes - on vaginal estrogen  since 08/2020, no increase in palpitations, still constipation - on Senokot, Miralax.  Pt denies feeling nodules in neck, hoarseness, dysphagia/odynophagia, SOB with lying down.  She has + FH of thyroid disorders in: M, M aunt, MGM, daughter. No FH of thyroid cancer. Daughter with DM1. No h/o radiation tx to head or neck. No recent use of iodine supplements.  Pt. also has a history of  prediabetes (gestational diabetes x2; latest HbA1c on 10/18/2019: 6.2%), hyperlipidemia situational anxiety, migraines, recurrent UTIs, history of PVCs. She had L TKR at the beginning of 2021.  ROS: + See HPI  Reviewed history: Past Medical History:  Diagnosis Date   Allergy    Anxiety    Arthritis    Asthma    Deviated septum    Dysrhythmia    pvc  occ   Headache    History of hiatal hernia    Hypothyroidism    PONV (postoperative nausea and vomiting)    Past Surgical History:  Procedure Laterality Date   CARPAL TUNNEL RELEASE     CESAREAN SECTION     HERNIA REPAIR  07/05/2018   JOINT REPLACEMENT     x3 scopes   KNEE ARTHROSCOPY     LAPAROSCOPIC ASSISTED SPIGELIAN HERNIA REPAIR N/A 07/05/2018   Procedure: LAPAROSCOPIC ASSISTED SPIGELIAN HERNIA REPAIR WITH MESH;  Surgeon: Berna Bue, MD;  Location: [facility] OR;  Service: General;  Laterality: N/A;   Social History   Socioeconomic History   Marital status: Married    Spouse name: Not on file   Number of children: 2   Years of education: Not on file   Highest education level: Not on file  Occupational History   Occupation: Contractor  Tobacco Use   Smoking status: Never   Smokeless tobacco: Never  Substance and Sexual Activity   Alcohol  use: Yes    Alcohol/week: 0.0 standard drinks    Comment: Beer, mixed drinks 2-3 once a week   Drug use: Never   Sexual activity: Yes    Birth control/protection: None    Comment: Menopause  Other Topics Concern   Not on file  Social History Narrative   Not on file   Social Determinants of Health   Financial Resource Strain: Not on file  Food Insecurity: Not on file  Transportation Needs: Not on file  Physical Activity: Not on file  Stress: Not on file  Social Connections: Not on file  Intimate Partner Violence: Not on file   Current Outpatient Medications on File Prior to Visit  Medication Sig Dispense Refill   acetaminophen (TYLENOL) 500 MG  tablet Take 1,000 mg by mouth every 6 (six) hours as needed for mild pain or headache.     ascorbic acid (VITAMIN C) 500 MG tablet Vitamin C 500 mg tablet (Patient not taking: Reported on 05/05/2020)     CALCIUM-VITAMIN D PO Take 1 tablet by mouth at bedtime.  (Patient not taking: Reported on 05/05/2020)     cetirizine (ZYRTEC) 10 MG tablet Take 10 mg by mouth at bedtime.      docusate sodium (COLACE) 100 MG capsule Take 100 mg by mouth at bedtime.     fluticasone (FLONASE) 50 MCG/ACT nasal spray Place 1 spray into both nostrils See admin instructions. Take 1 spray in each nostril twice a day during allergy season only.     guaiFENesin (MUCINEX) 600 MG 12 hr tablet Take 600 mg by mouth at bedtime.      levothyroxine (SYNTHROID) 88 MCG tablet Take 1 tablet (88 mcg total) by mouth daily before breakfast. 90 tablet 0   methocarbamol (ROBAXIN) 500 MG tablet methocarbamol 500 mg tablet     Multiple Vitamin (MULTIVITAMIN WITH MINERALS) TABS tablet Take 1 tablet by mouth at bedtime.  (Patient not taking: Reported on 05/05/2020)     oxyCODONE (OXY IR/ROXICODONE) 5 MG immediate release tablet oxycodone 5 mg tablet     traMADol (ULTRAM) 50 MG tablet tramadol 50 mg tablet     No current facility-administered medications on file prior to visit.   Allergies  Allergen Reactions   Sulfa Antibiotics Hives   Latex Rash   Family History  Problem Relation Age of Onset   Cancer Other    Hyperlipidemia Other    Hypertension Other    Diabetes Other    Arthritis Mother    Hearing loss Mother    Thyroid disease Mother    Cancer Sister    Diabetes Daughter    Thyroid disease Daughter    Hearing loss Father    Kidney disease Father    Thyroid disease Maternal Aunt    Thyroid disease Maternal Grandmother     PE: BP 120/78 (BP Location: Right Arm, Patient Position: Sitting, Cuff Size: Normal)   Pulse 76   Ht 5\' 3"  (1.6 m)   Wt 146 lb 9.6 oz (66.5 kg)   LMP 06/02/2012   SpO2 97%   BMI 25.97 kg/m  Wt  Readings from Last 3 Encounters:  09/18/20 146 lb 9.6 oz (66.5 kg)  05/05/20 146 lb 9.6 oz (66.5 kg)  07/05/18 142 lb 14.4 oz (64.8 kg)   Constitutional: normal weight, in NAD Eyes: PERRLA, EOMI, no exophthalmos ENT: moist mucous membranes, no thyromegaly, no cervical lymphadenopathy Cardiovascular: RRR, No MRG Respiratory: CTA B Gastrointestinal: abdomen soft, NT, ND, BS+ Musculoskeletal: no deformities, strength  intact in all 4, left knee more swollen Skin: moist, warm, no rashes Neurological: no tremor with outstretched hands, DTR decreased in L knee  ASSESSMENT: 1.  Acquired hypothyroidism  PLAN:  1. Patient with longstanding hypothyroidism, on brand-name levothyroxine therapy.  She cannot take generic levothyroxine due to previous variability in her TFTs.  An appeal for Synthroid was approved since last visit. -At last visit, she was still complaining of fatigue, cold intolerance, hair loss, anxiety, and weight gain -We checked thyroid antibodies and they were not elevated. -At that time, since TSH was higher in the normal range, we increased her levothyroxine. - latest thyroid labs reviewed with pt. >> excellent last month: Lab Results  Component Value Date   TSH 1.24 08/01/2020  - she continues on Synthroid d.a.w. 88 mcg daily -She feels a little better after increasing the dose of Synthroid. -At last visit, we discussed about pros and cons of using Armour therapy. I underlined the fact that:  Armour is purified from porcine thyroid glands Also, the ratio between T4 and T3 in Armour is physiologic for pigs, not for humans.  The short half life of T3 can cause fluctuations in blood levels, which can result in mood swings and heart rhythm abnormalities.  She does have a history of PVCs. - we discussed about taking the thyroid hormone every day, with water, >30 minutes before breakfast, separated by >4 hours from acid reflux medications, calcium, iron, multivitamins. Pt. is  taking it correctly. - will check thyroid tests today: TSH and fT4 - If labs are abnormal, she will need to return for repeat TFTs in 1.5 months -I will see her back in 6 months.  Needs refills for 1 year.  Office Visit on 09/18/2020  Component Date Value Ref Range Status   TSH 09/18/2020 1.13  0.35 - 5.50 uIU/mL Final   Free T4 09/18/2020 0.93  0.60 - 1.60 ng/dL Final   Comment: Specimens from patients who are undergoing biotin therapy and /or ingesting biotin supplements may contain high levels of biotin.  The higher biotin concentration in these specimens interferes with this Free T4 assay.  Specimens that contain high levels  of biotin may cause false high results for this Free T4 assay.  Please interpret results in light of the total clinical presentation of the patient.     TFTs are excellent.  Carlus Pavlov, MD PhD [facility]

## 2020-10-09 DIAGNOSIS — Z96652 Presence of left artificial knee joint: Secondary | ICD-10-CM | POA: Diagnosis not present

## 2020-11-18 DIAGNOSIS — R7303 Prediabetes: Secondary | ICD-10-CM | POA: Diagnosis not present

## 2020-11-18 DIAGNOSIS — F5104 Psychophysiologic insomnia: Secondary | ICD-10-CM | POA: Diagnosis not present

## 2020-11-18 DIAGNOSIS — E039 Hypothyroidism, unspecified: Secondary | ICD-10-CM | POA: Diagnosis not present

## 2020-11-18 DIAGNOSIS — Z23 Encounter for immunization: Secondary | ICD-10-CM | POA: Diagnosis not present

## 2020-11-20 DIAGNOSIS — L814 Other melanin hyperpigmentation: Secondary | ICD-10-CM | POA: Diagnosis not present

## 2020-11-20 DIAGNOSIS — L578 Other skin changes due to chronic exposure to nonionizing radiation: Secondary | ICD-10-CM | POA: Diagnosis not present

## 2020-11-20 DIAGNOSIS — D1801 Hemangioma of skin and subcutaneous tissue: Secondary | ICD-10-CM | POA: Diagnosis not present

## 2020-11-20 DIAGNOSIS — D1779 Benign lipomatous neoplasm of other sites: Secondary | ICD-10-CM | POA: Diagnosis not present

## 2021-01-27 DIAGNOSIS — Z01419 Encounter for gynecological examination (general) (routine) without abnormal findings: Secondary | ICD-10-CM | POA: Diagnosis not present

## 2021-01-27 DIAGNOSIS — Z1231 Encounter for screening mammogram for malignant neoplasm of breast: Secondary | ICD-10-CM | POA: Diagnosis not present

## 2021-01-27 DIAGNOSIS — Z6826 Body mass index (BMI) 26.0-26.9, adult: Secondary | ICD-10-CM | POA: Diagnosis not present

## 2021-02-04 DIAGNOSIS — R102 Pelvic and perineal pain: Secondary | ICD-10-CM | POA: Diagnosis not present

## 2021-02-04 DIAGNOSIS — Z0000 Encounter for general adult medical examination without abnormal findings: Secondary | ICD-10-CM | POA: Diagnosis not present

## 2021-02-04 DIAGNOSIS — R7303 Prediabetes: Secondary | ICD-10-CM | POA: Diagnosis not present

## 2021-02-04 DIAGNOSIS — F5104 Psychophysiologic insomnia: Secondary | ICD-10-CM | POA: Diagnosis not present

## 2021-02-04 DIAGNOSIS — E785 Hyperlipidemia, unspecified: Secondary | ICD-10-CM | POA: Diagnosis not present

## 2021-02-05 DIAGNOSIS — R102 Pelvic and perineal pain: Secondary | ICD-10-CM | POA: Diagnosis not present

## 2021-02-14 ENCOUNTER — Encounter: Payer: Self-pay | Admitting: Internal Medicine

## 2021-02-14 ENCOUNTER — Other Ambulatory Visit: Payer: Self-pay | Admitting: Internal Medicine

## 2021-02-14 DIAGNOSIS — E039 Hypothyroidism, unspecified: Secondary | ICD-10-CM

## 2021-02-16 MED ORDER — LEVOTHYROXINE SODIUM 88 MCG PO TABS: 88.0000 ug | ORAL_TABLET | Freq: Every day | ORAL | 3 refills | Status: DC

## 2021-02-16 NOTE — Addendum Note (Signed)
Addended by: Eden Lathe on: 02/16/2021 03:17 PM   Modules accepted: Orders

## 2021-02-17 DIAGNOSIS — M6281 Muscle weakness (generalized): Secondary | ICD-10-CM | POA: Diagnosis not present

## 2021-02-17 DIAGNOSIS — M62838 Other muscle spasm: Secondary | ICD-10-CM | POA: Diagnosis not present

## 2021-02-17 DIAGNOSIS — K5900 Constipation, unspecified: Secondary | ICD-10-CM | POA: Diagnosis not present

## 2021-02-17 DIAGNOSIS — N941 Unspecified dyspareunia: Secondary | ICD-10-CM | POA: Diagnosis not present

## 2021-02-20 DIAGNOSIS — M65331 Trigger finger, right middle finger: Secondary | ICD-10-CM | POA: Diagnosis not present

## 2021-03-03 DIAGNOSIS — M62838 Other muscle spasm: Secondary | ICD-10-CM | POA: Diagnosis not present

## 2021-03-03 DIAGNOSIS — N941 Unspecified dyspareunia: Secondary | ICD-10-CM | POA: Diagnosis not present

## 2021-03-03 DIAGNOSIS — M6281 Muscle weakness (generalized): Secondary | ICD-10-CM | POA: Diagnosis not present

## 2021-03-03 DIAGNOSIS — K5900 Constipation, unspecified: Secondary | ICD-10-CM | POA: Diagnosis not present

## 2021-03-23 ENCOUNTER — Encounter: Payer: Self-pay | Admitting: Internal Medicine

## 2021-03-23 ENCOUNTER — Other Ambulatory Visit: Payer: Self-pay

## 2021-03-23 ENCOUNTER — Ambulatory Visit (INDEPENDENT_AMBULATORY_CARE_PROVIDER_SITE_OTHER): Payer: BC Managed Care – PPO | Admitting: Internal Medicine

## 2021-03-23 VITALS — BP 110/72 | HR 84 | Ht 63.0 in | Wt 147.6 lb

## 2021-03-23 DIAGNOSIS — E039 Hypothyroidism, unspecified: Secondary | ICD-10-CM | POA: Diagnosis not present

## 2021-03-23 NOTE — Patient Instructions (Addendum)
Please continue Synthroid/Levothyroxine 88 mcg daily.  Take the thyroid hormone every day, with water, at least 30 minutes before breakfast, separated by at least 4 hours from: - acid reflux medications - calcium - iron - multivitamins  Please stop at the lab.  Please return in 1 year.

## 2021-03-23 NOTE — Progress Notes (Signed)
Patient ID: Maat Bauguess, female   DOB: [birth year], 56 y.o.   MRN: [id number]   This visit occurred during the SARS-CoV-2 public health emergency.  Safety protocols were in place, including screening questions prior to the visit, additional usage of staff PPE, and extensive cleaning of exam room while observing appropriate contact time as indicated for disinfecting solutions.   HPI  Salome Steichen is a 56 y.o.-year-old female, initially referred by her PCP, Dr. Hyman Hopes, returning for follow-up for acquired hypothyroidism.  Last visit 6 months ago.  Interim history: She just returned from vacation in [place]. She has no complaints at this visit.  Her previous symptoms: Weight gain, fatigue, constipation, hair loss, palpitations, all improved.  Reviewed and addended history: Pt. has been dx with hypothyroidism ~10 years ago >> on Synthroid d.a.w. 75 mcg >> increased to 88 mcg.  She felt better after the increase in dose. Her TFTs were too fluctuating on generic LT4. However, last month, generic prescription was sent to her pharmacy by mistake.  She does not feel different on this.  She takes the thyroid hormone: - fasting - with water - separated by >30 min from b'fast  - calcium, multivitamins >> both at night - no iron, PPIs - started probiotic at night  I reviewed pt's thyroid tests: Lab Results  Component Value Date   TSH 1.13 09/18/2020   TSH 1.24 08/01/2020   TSH 4.84 (H) 05/05/2020   FREET4 0.93 09/18/2020   FREET4 1.01 08/01/2020   FREET4 0.97 05/05/2020   T3FREE 2.7 05/05/2020  10/18/2019: TSH 4.45  Antithyroid antibodies were not elevated: Component     Latest Ref Rng & Units 05/05/2020  Thyroglobulin Ab     < or = 1 IU/mL <1  Thyroperoxidase Ab SerPl-aCnc     <9 IU/mL 7   She previously described: - + weight gain - + fatigue  - + cold intolerance (cold extremities) - no depression, + anxiety - + constipation - + palpitations off and on (esp. With caffeine,  insomnia) - + hair loss  She continues to have hot flashes- on vaginal estrogen  since 08/2020.  No increase in palpitations.  She still has constipation-on Senokot-MiraLAX, better controlled. Hair loss not as prominent now.  Pt denies feeling nodules in neck, hoarseness, dysphagia/odynophagia, SOB with lying down.  She has + FH of thyroid disorders in: M, M aunt, MGM, daughter. No FH of thyroid cancer. Daughter with DM1. No h/o radiation tx to head or neck. No recent use of iodine supplements.  Pt. also has a history of prediabetes (gestational diabetes x2; latest HbA1c on 10/18/2019: 6.2%), hyperlipidemia, situational anxiety, migraines, recurrent UTIs, history of PVCs. She had L TKR at the beginning of 2021.  ROS: + See HPI  Reviewed history: Past Medical History:  Diagnosis Date   Allergy    Anxiety    Arthritis    Asthma    Deviated septum    Dysrhythmia    pvc  occ   Headache    History of hiatal hernia    Hypothyroidism    PONV (postoperative nausea and vomiting)    Past Surgical History:  Procedure Laterality Date   CARPAL TUNNEL RELEASE     CESAREAN SECTION     HERNIA REPAIR  07/05/2018   JOINT REPLACEMENT     x3 scopes   KNEE ARTHROSCOPY     LAPAROSCOPIC ASSISTED SPIGELIAN HERNIA REPAIR N/A 07/05/2018   Procedure: LAPAROSCOPIC ASSISTED SPIGELIAN HERNIA REPAIR WITH MESH;  Surgeon: Fredricka Bonine,  Lady Deutscher, MD;  Location: [facility] OR;  Service: General;  Laterality: N/A;   Social History   Socioeconomic History   Marital status: Married    Spouse name: Not on file   Number of children: 2   Years of education: Not on file   Highest education level: Not on file  Occupational History   Occupation: Contractor  Tobacco Use   Smoking status: Never   Smokeless tobacco: Never  Substance and Sexual Activity   Alcohol use: Yes    Alcohol/week: 0.0 standard drinks    Comment: Beer, mixed drinks 2-3 once a week   Drug use: Never   Sexual activity: Yes     Birth control/protection: None    Comment: Menopause  Other Topics Concern   Not on file  Social History Narrative   Not on file   Social Determinants of Health   Financial Resource Strain: Not on file  Food Insecurity: Not on file  Transportation Needs: Not on file  Physical Activity: Not on file  Stress: Not on file  Social Connections: Not on file  Intimate Partner Violence: Not on file   Current Outpatient Medications on File Prior to Visit  Medication Sig Dispense Refill   acetaminophen (TYLENOL) 500 MG tablet Take 1,000 mg by mouth every 6 (six) hours as needed for mild pain or headache.     ascorbic acid (VITAMIN C) 500 MG tablet      CALCIUM-VITAMIN D PO Take 1 tablet by mouth at bedtime.     cetirizine (ZYRTEC) 10 MG tablet Take 10 mg by mouth at bedtime.      docusate sodium (COLACE) 100 MG capsule Take 100 mg by mouth at bedtime.     Estradiol 10 MCG TABS vaginal tablet Yuvafem 10 mcg vaginal tablet     fluticasone (FLONASE) 50 MCG/ACT nasal spray Place 1 spray into both nostrils See admin instructions. Take 1 spray in each nostril twice a day during allergy season only.     guaiFENesin (MUCINEX) 600 MG 12 hr tablet Take 600 mg by mouth at bedtime.      levothyroxine (SYNTHROID) 88 MCG tablet Take 1 tablet (88 mcg total) by mouth daily before breakfast. 90 tablet 3   Multiple Vitamin (MULTIVITAMIN WITH MINERALS) TABS tablet Take 1 tablet by mouth at bedtime.     No current facility-administered medications on file prior to visit.   Allergies  Allergen Reactions   Sulfa Antibiotics Hives   Latex Rash   Family History  Problem Relation Age of Onset   Cancer Other    Hyperlipidemia Other    Hypertension Other    Diabetes Other    Arthritis Mother    Hearing loss Mother    Thyroid disease Mother    Cancer Sister    Diabetes Daughter    Thyroid disease Daughter    Hearing loss Father    Kidney disease Father    Thyroid disease Maternal Aunt    Thyroid  disease Maternal Grandmother     PE: BP 110/72 (BP Location: Right Arm, Patient Position: Sitting, Cuff Size: Normal)    Pulse 84    Ht 5\' 3"  (1.6 m)    Wt 147 lb 9.6 oz (67 kg)    LMP 06/02/2012    SpO2 98%    BMI 26.15 kg/m  Wt Readings from Last 3 Encounters:  03/23/21 147 lb 9.6 oz (67 kg)  09/18/20 146 lb 9.6 oz (66.5 kg)  05/05/20 146 lb 9.6  oz (66.5 kg)   Constitutional: normal weight, in NAD Eyes: PERRLA, EOMI, no exophthalmos ENT: moist mucous membranes, no thyromegaly, no cervical lymphadenopathy Cardiovascular: RRR, No MRG Respiratory: CTA B Musculoskeletal: no deformities, strength intact in all 4, left knee more swollen Skin: moist, warm, no rashes Neurological: no tremor with outstretched hands, DTR decreased in L knee  ASSESSMENT: 1.  Acquired hypothyroidism  PLAN:  1. Patient with longstanding hypothyroidism, on brand-name levothyroxine therapy.  She cannot take generic LT4 due to previous variability in her TFTs.  An appeal for Synthroid was approved by her insurance before last visit.  However, last month, a prescription for generic levothyroxine was sent to her pharmacy by mistake.  She does not feel different on this.  We discussed that we can continue with this right now and if the tests starts to fluctuate, we can always go back to the brand name Synthroid. -She previously had fatigue, cold intolerance, hair loss, anxiety, and weight gain.  These all improved.   - Her antithyroid antibodies were not elevated in the past to be for diagnosis of Hashimoto's hypothyroidism - latest thyroid labs reviewed with pt. >> normal: Lab Results  Component Value Date   TSH 1.13 09/18/2020  - she is now on LT4 88 mcg daily - we previously discussed about Armour Thyroid-pros and cons.  I underlined the fact that: Armour is purified from porcine thyroid glands, which is not without risk for contaminants  Also, the ratio between T4 and T3 in Armour is physiologic for pigs, not  for humans The short half life of T3 can cause fluctuations in blood levels, which can result in mood swings and heart rhythm abnormalities.  She has a history of PVCs. The concentration of the active substances (T4 and T3) can be expected to vary between different Armour lots, which can cause variation in the thyroid function tests.  - we discussed about taking the thyroid hormone every day, with water, >30 minutes before breakfast, separated by >4 hours from acid reflux medications, calcium, iron, multivitamins. Pt. is taking it correctly. - will check thyroid tests today: TSH and fT4 - If labs are abnormal, she will need to return for repeat TFTs in 1.5 months -Otherwise, I will see her back in 1 year  Component     Latest Ref Rng & Units 03/23/2021  TSH     0.35 - 5.50 uIU/mL 0.31 (L)  T4,Free(Direct)     0.60 - 1.60 ng/dL 4.09   TSH is slightly low despite the relatively low dose of levothyroxine that she is taking.  We may need to go back to Synthroid brand name, but for now, would like to repeat the test on the same dose in approximately 1.5-2 months.  Carlus Pavlov, MD PhD [facility]

## 2021-03-24 DIAGNOSIS — N393 Stress incontinence (female) (male): Secondary | ICD-10-CM | POA: Diagnosis not present

## 2021-03-24 DIAGNOSIS — M62838 Other muscle spasm: Secondary | ICD-10-CM | POA: Diagnosis not present

## 2021-03-24 DIAGNOSIS — M6281 Muscle weakness (generalized): Secondary | ICD-10-CM | POA: Diagnosis not present

## 2021-03-24 DIAGNOSIS — M65331 Trigger finger, right middle finger: Secondary | ICD-10-CM | POA: Diagnosis not present

## 2021-03-24 DIAGNOSIS — K5900 Constipation, unspecified: Secondary | ICD-10-CM | POA: Diagnosis not present

## 2021-03-24 LAB — TSH: TSH: 0.31 u[IU]/mL — ABNORMAL LOW (ref 0.35–5.50)

## 2021-03-24 LAB — T4, FREE: Free T4: 1.05 ng/dL (ref 0.60–1.60)

## 2021-04-02 DIAGNOSIS — Z96652 Presence of left artificial knee joint: Secondary | ICD-10-CM | POA: Diagnosis not present

## 2021-04-07 DIAGNOSIS — N393 Stress incontinence (female) (male): Secondary | ICD-10-CM | POA: Diagnosis not present

## 2021-04-07 DIAGNOSIS — M6281 Muscle weakness (generalized): Secondary | ICD-10-CM | POA: Diagnosis not present

## 2021-04-07 DIAGNOSIS — K5900 Constipation, unspecified: Secondary | ICD-10-CM | POA: Diagnosis not present

## 2021-04-07 DIAGNOSIS — M62838 Other muscle spasm: Secondary | ICD-10-CM | POA: Diagnosis not present

## 2021-04-21 DIAGNOSIS — N94818 Other vulvodynia: Secondary | ICD-10-CM | POA: Diagnosis not present

## 2021-04-29 DIAGNOSIS — F411 Generalized anxiety disorder: Secondary | ICD-10-CM | POA: Diagnosis not present

## 2021-05-07 DIAGNOSIS — N760 Acute vaginitis: Secondary | ICD-10-CM | POA: Diagnosis not present

## 2021-05-12 ENCOUNTER — Encounter: Payer: Self-pay | Admitting: Internal Medicine

## 2021-05-12 ENCOUNTER — Other Ambulatory Visit (INDEPENDENT_AMBULATORY_CARE_PROVIDER_SITE_OTHER): Payer: BC Managed Care – PPO

## 2021-05-12 DIAGNOSIS — E039 Hypothyroidism, unspecified: Secondary | ICD-10-CM | POA: Diagnosis not present

## 2021-05-12 LAB — T4, FREE: Free T4: 1.31 ng/dL (ref 0.60–1.60)

## 2021-05-12 LAB — TSH: TSH: 0.25 u[IU]/mL — ABNORMAL LOW (ref 0.35–5.50)

## 2021-05-12 LAB — T3, FREE: T3, Free: 2.8 pg/mL (ref 2.3–4.2)

## 2021-05-12 MED ORDER — LEVOTHYROXINE SODIUM 75 MCG PO TABS: 75.0000 ug | ORAL_TABLET | Freq: Every day | ORAL | 3 refills | Status: DC

## 2021-05-13 DIAGNOSIS — N941 Unspecified dyspareunia: Secondary | ICD-10-CM | POA: Diagnosis not present

## 2021-05-13 DIAGNOSIS — L292 Pruritus vulvae: Secondary | ICD-10-CM | POA: Diagnosis not present

## 2021-05-13 DIAGNOSIS — F411 Generalized anxiety disorder: Secondary | ICD-10-CM | POA: Diagnosis not present

## 2021-05-15 MED ORDER — LEVOTHYROXINE SODIUM 75 MCG PO TABS: 75.0000 ug | ORAL_TABLET | Freq: Every day | ORAL | 1 refills | Status: DC

## 2021-05-26 DIAGNOSIS — F411 Generalized anxiety disorder: Secondary | ICD-10-CM | POA: Diagnosis not present

## 2021-06-02 DIAGNOSIS — E785 Hyperlipidemia, unspecified: Secondary | ICD-10-CM | POA: Diagnosis not present

## 2021-06-02 DIAGNOSIS — R7303 Prediabetes: Secondary | ICD-10-CM | POA: Diagnosis not present

## 2021-06-09 DIAGNOSIS — F411 Generalized anxiety disorder: Secondary | ICD-10-CM | POA: Diagnosis not present

## 2021-06-10 ENCOUNTER — Other Ambulatory Visit (HOSPITAL_COMMUNITY): Payer: Self-pay | Admitting: Family Medicine

## 2021-06-10 ENCOUNTER — Other Ambulatory Visit: Payer: Self-pay | Admitting: Family Medicine

## 2021-06-10 DIAGNOSIS — E785 Hyperlipidemia, unspecified: Secondary | ICD-10-CM

## 2021-06-15 ENCOUNTER — Ambulatory Visit (HOSPITAL_BASED_OUTPATIENT_CLINIC_OR_DEPARTMENT_OTHER)
Admission: RE | Admit: 2021-06-15 | Discharge: 2021-06-15 | Disposition: A | Discharge status: Home / Self Care | Payer: BC Managed Care – PPO | Source: Ambulatory Visit | Attending: Family Medicine | Admitting: Family Medicine

## 2021-06-15 DIAGNOSIS — E785 Hyperlipidemia, unspecified: Secondary | ICD-10-CM | POA: Insufficient documentation

## 2021-06-30 DIAGNOSIS — M65331 Trigger finger, right middle finger: Secondary | ICD-10-CM | POA: Diagnosis not present

## 2021-07-01 DIAGNOSIS — F411 Generalized anxiety disorder: Secondary | ICD-10-CM | POA: Diagnosis not present

## 2021-07-07 DIAGNOSIS — M65331 Trigger finger, right middle finger: Secondary | ICD-10-CM | POA: Diagnosis not present

## 2021-07-29 DIAGNOSIS — F411 Generalized anxiety disorder: Secondary | ICD-10-CM | POA: Diagnosis not present

## 2021-08-10 DIAGNOSIS — E039 Hypothyroidism, unspecified: Secondary | ICD-10-CM | POA: Diagnosis not present

## 2021-08-10 DIAGNOSIS — E785 Hyperlipidemia, unspecified: Secondary | ICD-10-CM | POA: Diagnosis not present

## 2021-08-10 DIAGNOSIS — N94818 Other vulvodynia: Secondary | ICD-10-CM | POA: Diagnosis not present

## 2021-08-12 DIAGNOSIS — F411 Generalized anxiety disorder: Secondary | ICD-10-CM | POA: Diagnosis not present

## 2021-08-25 DIAGNOSIS — F411 Generalized anxiety disorder: Secondary | ICD-10-CM | POA: Diagnosis not present

## 2021-09-09 DIAGNOSIS — F411 Generalized anxiety disorder: Secondary | ICD-10-CM | POA: Diagnosis not present

## 2021-09-21 DIAGNOSIS — E785 Hyperlipidemia, unspecified: Secondary | ICD-10-CM | POA: Diagnosis not present

## 2021-09-23 DIAGNOSIS — F411 Generalized anxiety disorder: Secondary | ICD-10-CM | POA: Diagnosis not present

## 2021-10-08 DIAGNOSIS — F411 Generalized anxiety disorder: Secondary | ICD-10-CM | POA: Diagnosis not present

## 2021-10-20 ENCOUNTER — Encounter: Payer: Self-pay | Admitting: Internal Medicine

## 2021-10-20 DIAGNOSIS — E039 Hypothyroidism, unspecified: Secondary | ICD-10-CM

## 2021-10-20 MED ORDER — LEVOTHYROXINE SODIUM 75 MCG PO TABS: 75.0000 ug | ORAL_TABLET | Freq: Every day | ORAL | 1 refills | Status: DC

## 2021-10-21 DIAGNOSIS — F411 Generalized anxiety disorder: Secondary | ICD-10-CM | POA: Diagnosis not present

## 2021-10-28 ENCOUNTER — Other Ambulatory Visit: Payer: Self-pay | Admitting: Internal Medicine

## 2021-10-28 DIAGNOSIS — E039 Hypothyroidism, unspecified: Secondary | ICD-10-CM

## 2021-11-04 DIAGNOSIS — F411 Generalized anxiety disorder: Secondary | ICD-10-CM | POA: Diagnosis not present

## 2021-11-18 DIAGNOSIS — F411 Generalized anxiety disorder: Secondary | ICD-10-CM | POA: Diagnosis not present

## 2021-11-23 DIAGNOSIS — L814 Other melanin hyperpigmentation: Secondary | ICD-10-CM | POA: Diagnosis not present

## 2021-11-23 DIAGNOSIS — L578 Other skin changes due to chronic exposure to nonionizing radiation: Secondary | ICD-10-CM | POA: Diagnosis not present

## 2021-11-23 DIAGNOSIS — L821 Other seborrheic keratosis: Secondary | ICD-10-CM | POA: Diagnosis not present

## 2021-11-23 DIAGNOSIS — D2371 Other benign neoplasm of skin of right lower limb, including hip: Secondary | ICD-10-CM | POA: Diagnosis not present

## 2021-12-02 DIAGNOSIS — F4320 Adjustment disorder, unspecified: Secondary | ICD-10-CM | POA: Diagnosis not present

## 2021-12-03 ENCOUNTER — Encounter: Payer: Self-pay | Admitting: Internal Medicine

## 2021-12-04 ENCOUNTER — Other Ambulatory Visit: Payer: Self-pay

## 2021-12-04 DIAGNOSIS — E039 Hypothyroidism, unspecified: Secondary | ICD-10-CM

## 2021-12-04 MED ORDER — LEVOTHYROXINE SODIUM 75 MCG PO TABS: 75.0000 ug | ORAL_TABLET | Freq: Every day | ORAL | 1 refills | Status: DC

## 2021-12-30 DIAGNOSIS — F411 Generalized anxiety disorder: Secondary | ICD-10-CM | POA: Diagnosis not present

## 2022-01-19 DIAGNOSIS — M25561 Pain in right knee: Secondary | ICD-10-CM | POA: Diagnosis not present

## 2022-01-21 DIAGNOSIS — M25561 Pain in right knee: Secondary | ICD-10-CM | POA: Diagnosis not present

## 2022-01-22 ENCOUNTER — Other Ambulatory Visit (INDEPENDENT_AMBULATORY_CARE_PROVIDER_SITE_OTHER): Payer: BC Managed Care – PPO

## 2022-01-22 DIAGNOSIS — E039 Hypothyroidism, unspecified: Secondary | ICD-10-CM | POA: Diagnosis not present

## 2022-01-22 LAB — T4, FREE: Free T4: 0.98 ng/dL (ref 0.60–1.60)

## 2022-01-22 LAB — TSH: TSH: 0.44 u[IU]/mL (ref 0.35–5.50)

## 2022-01-26 DIAGNOSIS — M25561 Pain in right knee: Secondary | ICD-10-CM | POA: Diagnosis not present

## 2022-01-27 DIAGNOSIS — F411 Generalized anxiety disorder: Secondary | ICD-10-CM | POA: Diagnosis not present

## 2022-02-03 DIAGNOSIS — M25561 Pain in right knee: Secondary | ICD-10-CM | POA: Diagnosis not present

## 2022-02-10 DIAGNOSIS — F411 Generalized anxiety disorder: Secondary | ICD-10-CM | POA: Diagnosis not present

## 2022-02-11 DIAGNOSIS — F5104 Psychophysiologic insomnia: Secondary | ICD-10-CM | POA: Diagnosis not present

## 2022-02-11 DIAGNOSIS — R7303 Prediabetes: Secondary | ICD-10-CM | POA: Diagnosis not present

## 2022-02-11 DIAGNOSIS — H9313 Tinnitus, bilateral: Secondary | ICD-10-CM | POA: Diagnosis not present

## 2022-02-11 DIAGNOSIS — E785 Hyperlipidemia, unspecified: Secondary | ICD-10-CM | POA: Diagnosis not present

## 2022-02-11 DIAGNOSIS — Z0000 Encounter for general adult medical examination without abnormal findings: Secondary | ICD-10-CM | POA: Diagnosis not present

## 2022-02-24 DIAGNOSIS — Z6824 Body mass index (BMI) 24.0-24.9, adult: Secondary | ICD-10-CM | POA: Diagnosis not present

## 2022-02-24 DIAGNOSIS — Z01419 Encounter for gynecological examination (general) (routine) without abnormal findings: Secondary | ICD-10-CM | POA: Diagnosis not present

## 2022-02-24 DIAGNOSIS — Z1231 Encounter for screening mammogram for malignant neoplasm of breast: Secondary | ICD-10-CM | POA: Diagnosis not present

## 2022-02-24 DIAGNOSIS — F411 Generalized anxiety disorder: Secondary | ICD-10-CM | POA: Diagnosis not present

## 2022-03-08 ENCOUNTER — Other Ambulatory Visit: Payer: Self-pay | Admitting: Obstetrics and Gynecology

## 2022-03-08 DIAGNOSIS — N63 Unspecified lump in unspecified breast: Secondary | ICD-10-CM

## 2022-03-09 DIAGNOSIS — F411 Generalized anxiety disorder: Secondary | ICD-10-CM | POA: Diagnosis not present

## 2022-03-12 DIAGNOSIS — M25561 Pain in right knee: Secondary | ICD-10-CM | POA: Diagnosis not present

## 2022-03-12 DIAGNOSIS — Z96652 Presence of left artificial knee joint: Secondary | ICD-10-CM | POA: Diagnosis not present

## 2022-03-17 ENCOUNTER — Ambulatory Visit
Admission: RE | Admit: 2022-03-17 | Discharge: 2022-03-17 | Disposition: A | Discharge status: Home / Self Care | Payer: BC Managed Care – PPO | Source: Ambulatory Visit | Attending: Obstetrics and Gynecology | Admitting: Obstetrics and Gynecology

## 2022-03-17 DIAGNOSIS — R922 Inconclusive mammogram: Secondary | ICD-10-CM | POA: Diagnosis not present

## 2022-03-17 DIAGNOSIS — N63 Unspecified lump in unspecified breast: Secondary | ICD-10-CM

## 2022-03-17 DIAGNOSIS — N6489 Other specified disorders of breast: Secondary | ICD-10-CM | POA: Diagnosis not present

## 2022-03-23 ENCOUNTER — Encounter: Payer: Self-pay | Admitting: Internal Medicine

## 2022-03-23 ENCOUNTER — Ambulatory Visit (INDEPENDENT_AMBULATORY_CARE_PROVIDER_SITE_OTHER): Payer: BC Managed Care – PPO | Admitting: Internal Medicine

## 2022-03-23 VITALS — BP 118/80 | HR 70 | Ht 63.0 in | Wt 138.8 lb

## 2022-03-23 DIAGNOSIS — E039 Hypothyroidism, unspecified: Secondary | ICD-10-CM

## 2022-03-23 LAB — T4, FREE: Free T4: 0.84 ng/dL (ref 0.60–1.60)

## 2022-03-23 LAB — TSH: TSH: 2.42 u[IU]/mL (ref 0.35–5.50)

## 2022-03-23 MED ORDER — LEVOTHYROXINE SODIUM 75 MCG PO TABS: 75.0000 ug | ORAL_TABLET | Freq: Every day | ORAL | 3 refills | Status: DC

## 2022-03-23 NOTE — Patient Instructions (Signed)
Please continue Levothyroxine 75 mcg daily.  Take the thyroid hormone every day, with water, at least 30 minutes before breakfast, separated by at least 4 hours from: - acid reflux medications - calcium - iron - multivitamins  Please stop at the lab.  Please return in 1 year.

## 2022-03-23 NOTE — Progress Notes (Signed)
Patient ID: Shannon Mccullough, female   DOB: Aug 15, 1965, 57 y.o.   MRN: [id number]   HPI  Shannon Mccullough is a 57 y.o.-year-old female, initially referred by her PCP, Dr. Justin Mend, returning for follow-up for acquired hypothyroidism.  Last visit 1 year ago  Interim history: She has no complaints at this visit.  She mentions that she lost approximately 20 pounds since last visit in an effort to avoid starting statins, but then she gained 11 pounds back and she ended up starting Crestor 1 week ago.  She tolerates it well. Her previous symptoms: Weight gain, fatigue, constipation, hair loss, palpitations, all improved. She had a R knee steroid inj in 12/2021.  She may need to have another one.  Reviewed and addended history: Pt. has been dx with hypothyroidism ~10 years ago >> on Synthroid d.a.w. 75 mcg >> increased to 88 mcg.  She felt better after the increase in dose. Her TFTs were too fluctuating on generic LT4. However, last month, generic prescription was sent to her pharmacy by mistake.  She did not feel different on this. LT4 dose was decreased to 75 mcg daily in 04/2021.  At that time, generic levothyroxine was sent to her pharmacy but patient mentions that Synthroid is written on her tablets.  She takes the thyroid hormone: - fasting - with water - separated by >30 min from b'fast  - calcium, multivitamins >> both at night - no iron, PPIs  I reviewed pt's thyroid tests: Lab Results  Component Value Date   TSH 0.44 01/22/2022   TSH 0.25 (L) 05/12/2021   TSH 0.31 (L) 03/23/2021   TSH 1.13 09/18/2020   TSH 1.24 08/01/2020   TSH 4.84 (H) 05/05/2020   FREET4 0.98 01/22/2022   FREET4 1.31 05/12/2021   FREET4 1.05 03/23/2021   FREET4 0.93 09/18/2020   FREET4 1.01 08/01/2020   FREET4 0.97 05/05/2020   T3FREE 2.8 05/12/2021   T3FREE 2.7 05/05/2020  10/18/2019: TSH 4.45  Antithyroid antibodies were not elevated: Component     Latest Ref Rng & Units 05/05/2020  Thyroglobulin Ab     < or =  1 IU/mL <1  Thyroperoxidase Ab SerPl-aCnc     <9 IU/mL 7   She previously described: - + weight gain - + fatigue  - + cold intolerance (cold extremities) - no depression, + anxiety - + constipation - + palpitations off and on (esp. With caffeine, insomnia) - + hair loss  She continues to have hot flashes- on vaginal estrogen  since 08/2020.  No increase in palpitations.  She still has constipation-on Senokot-MiraLAX, better controlled. Hair loss not as prominent now.  Pt denies: - feeling nodules in neck - hoarseness - dysphagia - choking  She has + FH of thyroid disorders in: M, M aunt, MGM, daughter. No FH of thyroid cancer. Daughter with DM1. No h/o radiation tx to head or neck. No recent use of iodine supplements.  Pt. also has a history of prediabetes (gestational diabetes x2; latest HbA1c on 10/18/2019: 6.2%), hyperlipidemia, situational anxiety, migraines, recurrent UTIs, history of PVCs. She had L TKR at the beginning of 2021.  ROS: + See HPI  Reviewed history: Past Medical History:  Diagnosis Date   Allergy    Anxiety    Arthritis    Asthma    Deviated septum    Dysrhythmia    pvc  occ   Headache    History of hiatal hernia    Hypothyroidism    PONV (postoperative nausea and  vomiting)    Past Surgical History:  Procedure Laterality Date   CARPAL TUNNEL RELEASE     CESAREAN SECTION     HERNIA REPAIR  07/05/2018   JOINT REPLACEMENT     x3 scopes   KNEE ARTHROSCOPY     LAPAROSCOPIC ASSISTED SPIGELIAN HERNIA REPAIR N/A 07/05/2018   Procedure: LAPAROSCOPIC ASSISTED SPIGELIAN HERNIA REPAIR WITH MESH;  Surgeon: Clovis Riley, MD;  Location: [facility] OR;  Service: General;  Laterality: N/A;   Social History   Socioeconomic History   Marital status: Married    Spouse name: Not on file   Number of children: 2   Years of education: Not on file   Highest education level: Not on file  Occupational History   Occupation: Sales promotion account executive   Tobacco Use   Smoking status: Never   Smokeless tobacco: Never  Substance and Sexual Activity   Alcohol use: Yes    Alcohol/week: 0.0 standard drinks of alcohol    Comment: Beer, mixed drinks 2-3 once a week   Drug use: Never   Sexual activity: Yes    Birth control/protection: None    Comment: Menopause  Other Topics Concern   Not on file  Social History Narrative   Not on file   Social Determinants of Health   Financial Resource Strain: Not on file  Food Insecurity: Not on file  Transportation Needs: Not on file  Physical Activity: Not on file  Stress: Not on file  Social Connections: Not on file  Intimate Partner Violence: Not on file   Current Outpatient Medications on File Prior to Visit  Medication Sig Dispense Refill   acetaminophen (TYLENOL) 500 MG tablet Take 1,000 mg by mouth every 6 (six) hours as needed for mild pain or headache.     ascorbic acid (VITAMIN C) 500 MG tablet      CALCIUM-VITAMIN D PO Take 1 tablet by mouth at bedtime.     cetirizine (ZYRTEC) 10 MG tablet Take 10 mg by mouth at bedtime.      docusate sodium (COLACE) 100 MG capsule Take 100 mg by mouth at bedtime.     Estradiol 10 MCG TABS vaginal tablet Yuvafem 10 mcg vaginal tablet     fluticasone (FLONASE) 50 MCG/ACT nasal spray Place 1 spray into both nostrils See admin instructions. Take 1 spray in each nostril twice a day during allergy season only.     guaiFENesin (MUCINEX) 600 MG 12 hr tablet Take 600 mg by mouth at bedtime.      levothyroxine (SYNTHROID) 75 MCG tablet Take 1 tablet (75 mcg total) by mouth daily. 45 tablet 1   Multiple Vitamin (MULTIVITAMIN WITH MINERALS) TABS tablet Take 1 tablet by mouth at bedtime.     No current facility-administered medications on file prior to visit.   Allergies  Allergen Reactions   Sulfa Antibiotics Hives   Latex Rash   Family History  Problem Relation Age of Onset   Arthritis Mother    Hearing loss Mother    Thyroid disease Mother     Hearing loss Father    Kidney disease Father    Breast cancer Sister 79       triple neg   Cancer Sister    Diabetes Daughter    Thyroid disease Daughter    Thyroid disease Maternal Aunt    Breast cancer Paternal Aunt    Thyroid disease Maternal Grandmother    Breast cancer Cousin        23s, paternal  cousin   Cancer Other    Hyperlipidemia Other    Hypertension Other    Diabetes Other     PE: BP 118/80 (BP Location: Right Arm, Patient Position: Sitting, Cuff Size: Normal)   Pulse 70   Ht '5\' 3"'$  (1.6 m)   Wt 138 lb 12.8 oz (63 kg)   LMP 06/02/2012   SpO2 99%   BMI 24.59 kg/m  Wt Readings from Last 3 Encounters:  03/23/22 138 lb 12.8 oz (63 kg)  03/23/21 147 lb 9.6 oz (67 kg)  09/18/20 146 lb 9.6 oz (66.5 kg)   Constitutional: normal weight, in NAD Eyes: EOMI, no exophthalmos ENT:  no thyromegaly, no cervical lymphadenopathy Cardiovascular: RRR, No MRG Respiratory: CTA B Musculoskeletal: no deformities Skin: no rashes Neurological: no tremor with outstretched hands  ASSESSMENT: 1.  Acquired hypothyroidism  PLAN:  1. Patient with longstanding hypothyroidism, on brand-name Synthroid.  We cannot change to generic levothyroxine due to previous variability in her TFTs.  An appeal for Synthroid was approved by her insurance but before last visit a generic levothyroxine prescription was sent to her pharmacy by mistake.  She did not feel different on this but the TSH level returned suppressed so we discussed about possibly switching back to brand-name.  However, we ended up continuing the generic LT4 and decreasing the dose to 75 mcg daily. -She previously had fatigue, cold intolerance, hair loss, anxiety, and weight gain, but these have improved -We checked her for Hashimoto's hypothyroidism but antithyroid antibodies were not elevated in the past: -Latest TSH was normal: Lab Results  Component Value Date   TSH 0.44 01/22/2022  - she continues on LT4 75 mcg daily - we  previously discussed about Armour Thyroid-reviewed pros and cons: Armour is purified from porcine thyroid glands, which is not without risk for contaminants  Also, the ratio between T4 and T3 in Armour is physiologic for pigs, not for humans The short half life of T3 can cause fluctuations in blood levels, which can result in mood swings and heart rhythm abnormalities.  She has a history of PVCs. The concentration of the active substances (T4 and T3) can be expected to vary between different Armour lots, which can cause variation in the thyroid function tests.  - pt feels good on the current dose. She lost 20 lbs since last OV but gained 11 lbs back - we discussed about taking the thyroid hormone every day, with water, >30 minutes before breakfast, separated by >4 hours from acid reflux medications, calcium, iron, multivitamins. Pt. is taking it correctly. - will check thyroid tests today: TSH and fT4 - If labs are abnormal, she will need to return for repeat TFTs in 1.5 months - Otherwise, we will see her back in a year  Needs refills for 3 mo.  Component     Latest Ref Rng 03/23/2022  TSH     0.35 - 5.50 uIU/mL 2.42   T4,Free(Direct)     0.60 - 1.60 ng/dL 0.84   Thyroid tests are normal.  Will continue the current dose of LT4.  Philemon Kingdom, MD PhD [facility] Endocrinology

## 2022-03-24 DIAGNOSIS — F411 Generalized anxiety disorder: Secondary | ICD-10-CM | POA: Diagnosis not present

## 2022-03-29 DIAGNOSIS — H00025 Hordeolum internum left lower eyelid: Secondary | ICD-10-CM | POA: Diagnosis not present

## 2022-04-07 DIAGNOSIS — F411 Generalized anxiety disorder: Secondary | ICD-10-CM | POA: Diagnosis not present

## 2022-04-09 ENCOUNTER — Ambulatory Visit: Payer: BC Managed Care – PPO | Admitting: Psychiatry

## 2022-04-16 DIAGNOSIS — Z96652 Presence of left artificial knee joint: Secondary | ICD-10-CM | POA: Diagnosis not present

## 2022-04-16 DIAGNOSIS — M1712 Unilateral primary osteoarthritis, left knee: Secondary | ICD-10-CM | POA: Diagnosis not present

## 2022-04-20 ENCOUNTER — Telehealth: Payer: Self-pay | Admitting: Psychiatry

## 2022-04-20 ENCOUNTER — Encounter: Payer: Self-pay | Admitting: Psychiatry

## 2022-04-20 ENCOUNTER — Ambulatory Visit (INDEPENDENT_AMBULATORY_CARE_PROVIDER_SITE_OTHER): Payer: BC Managed Care – PPO | Admitting: Psychiatry

## 2022-04-20 VITALS — BP 104/72 | HR 80 | Ht 63.0 in | Wt 140.8 lb

## 2022-04-20 DIAGNOSIS — G4453 Primary thunderclap headache: Secondary | ICD-10-CM | POA: Diagnosis not present

## 2022-04-20 DIAGNOSIS — G4482 Headache associated with sexual activity: Secondary | ICD-10-CM

## 2022-04-20 MED ORDER — INDOMETHACIN 25 MG PO CAPS: ORAL_CAPSULE | ORAL | 6 refills | Status: AC

## 2022-04-20 NOTE — Progress Notes (Signed)
Referring:  Marylynn Pearson, MD [address], [place],  [place] [address]  PCP: Marylynn Pearson, MD  Neurology was asked to evaluate Shannon Mccullough, a 57 year old female for a chief complaint of headaches.  Our recommendations of care will be communicated by shared medical record.    CC:  headaches  History provided from self  HPI:  Medical co-morbidities: hypothyroidism, migraines  The patient presents for evaluation of headaches which began 2 years ago. Headaches are triggered by orgasm. She will develop a sudden severe occipital headache at onset of orgasm which lasts for 5-6 minutes, then resolves. Headache is not associated with photophobia, phonophobia, or nausea.  She does have a history of migraines, which worsened after menopause. Her current headaches feel different from her typical migraines.  Headache History: Onset: 2 years ago Triggers: orgasm Aura: no Location: occiput Quality/Description: Associated Symptoms:  Photophobia: no  Phonophobia: no  Nausea: no Vomiting: no Worse with activity?: yes Duration of headaches: 5-6 minutes  Current Treatment: Abortive none  Preventative none  Prior Therapies                                 Gabapentin  Robaxin 500 mg PRN Flexeril 10 mg PRN Naproxen Imitrex - lack of efficacy   LABS: CBC    Component Value Date/Time   WBC 4.6 07/04/2018 0938   RBC 3.99 07/04/2018 0938   HGB 12.1 07/04/2018 0938   HCT 37.5 07/04/2018 0938   PLT 254 07/04/2018 0938   MCV 94.0 07/04/2018 0938   MCH 30.3 07/04/2018 0938   MCHC 32.3 07/04/2018 0938   RDW 12.1 07/04/2018 0938   LYMPHSABS 1.7 07/04/2018 0938   MONOABS 0.4 07/04/2018 0938   EOSABS 0.1 07/04/2018 0938   BASOSABS 0.0 07/04/2018 0938      Latest Ref Rng & Units 07/04/2018    9:38 AM 11/25/2007    9:05 PM  CMP  Glucose 70 - 99 mg/dL 84  106   BUN 6 - 20 mg/dL 12  16   Creatinine 0.44 - 1.00 mg/dL 0.80  1.0   Sodium 135 - 145 mmol/L 139   140   Potassium 3.5 - 5.1 mmol/L 4.2  4.2   Chloride 98 - 111 mmol/L 106  108   CO2 22 - 32 mmol/L 25    Calcium 8.9 - 10.3 mg/dL 9.3       IMAGING:  none  Current Outpatient Medications on File Prior to Visit  Medication Sig Dispense Refill   acetaminophen (TYLENOL) 500 MG tablet Take 1,000 mg by mouth every 6 (six) hours as needed for mild pain or headache.     CALCIUM-VITAMIN D PO Take 1 tablet by mouth at bedtime.     cetirizine (ZYRTEC) 10 MG tablet Take 10 mg by mouth at bedtime.      Estradiol 10 MCG TABS vaginal tablet Yuvafem 10 mcg vaginal tablet     guaiFENesin (MUCINEX) 600 MG 12 hr tablet Take 600 mg by mouth at bedtime.      levothyroxine (SYNTHROID) 75 MCG tablet Take 1 tablet (75 mcg total) by mouth daily. 90 tablet 3   Multiple Vitamin (MULTIVITAMIN WITH MINERALS) TABS tablet Take 1 tablet by mouth at bedtime.     rosuvastatin (CRESTOR) 5 MG tablet Take 5 mg by mouth daily.     ascorbic acid (VITAMIN C) 500 MG tablet  No current facility-administered medications on file prior to visit.     Allergies: Allergies  Allergen Reactions   Sulfa Antibiotics Hives   Latex Rash    Family History: Migraine or other headaches in the family:  mother and sister had migraines Aneurysms in a first degree relative:  no Brain tumors in the family:  no Other neurological illness in the family:   mother has Alzheimers, dad had Parkinsons  Past Medical History: Past Medical History:  Diagnosis Date   Allergy    Anxiety    Arthritis    Asthma    Deviated septum    Dysrhythmia    pvc  occ   Headache    History of hiatal hernia    Hypercholesteremia    Hypothyroidism    PONV (postoperative nausea and vomiting)     Past Surgical History Past Surgical History:  Procedure Laterality Date   CARPAL TUNNEL RELEASE     CESAREAN SECTION     HERNIA REPAIR  07/05/2018   JOINT REPLACEMENT     x3 scopes   KNEE ARTHROSCOPY     LAPAROSCOPIC ASSISTED SPIGELIAN HERNIA  REPAIR N/A 07/05/2018   Procedure: LAPAROSCOPIC ASSISTED [place];  Surgeon: Clovis Riley, MD;  Location: [facility] OR;  Service: General;  Laterality: N/A;    Social History: Social History   Tobacco Use   Smoking status: Never   Smokeless tobacco: Never  Vaping Use   Vaping Use: Never used  Substance Use Topics   Alcohol use: Yes    Alcohol/week: 0.0 standard drinks of alcohol    Comment: Beer, mixed drinks 2-3 once a week   Drug use: Never    ROS: Negative for fevers, chills. Positive for headaches. All other systems reviewed and negative unless stated otherwise in HPI.   Physical Exam:   Vital Signs: BP 104/72 (BP Location: Left Arm, Patient Position: Sitting, Cuff Size: Normal)   Pulse 80   Ht 5\' 3"  (1.6 m)   Wt 140 lb 12.8 oz (63.9 kg)   LMP 06/02/2012   BMI 24.94 kg/m  GENERAL: well appearing,in no acute distress,alert SKIN:  Color, texture, turgor normal. No rashes or lesions HEAD:  Normocephalic/atraumatic. CV:  RRR RESP: Normal respiratory effort MSK: no tenderness to palpation over occiput, neck, or shoulders  NEUROLOGICAL: Mental Status: Alert, oriented to person, place and time,Follows commands Cranial Nerves: PERRL, visual fields intact to confrontation, extraocular movements intact, facial sensation intact, no facial droop or ptosis, hearing grossly intact, no dysarthria Motor: muscle strength 5/5 both upper and lower extremities Reflexes: 2+ throughout Sensation: intact to light touch all 4 extremities Coordination: Finger-to- nose-finger intact bilaterally Gait: normal-based   IMPRESSION: 57 year old female with a history of hypothyroidism, migraines who presents for evaluation of headaches associated with orgasm. Will order MRI brain and CTA head/neck to rule out structural causes of thunderclap headache including vascular causes such as aneurysm/dissection. Will start indomethacin 30 minutes prior to sex to help prevent  orgasm headaches. If vascular imaging is normal will also plan to start a PRN triptan for rescue.  PLAN: -MRI brain, CTA head/neck -Take Indomethacin 25-50 mg 30 minutes prior to sex to prevent orgasm headache -If imaging is normal will start Zomig nasal spray for rescue -Next steps: consider propranolol for headache prevention  I spent a total of 28 minutes chart reviewing and counseling the patient. Headache education was done. Discussed treatment options including preventive and acute medications. Discussed medication side effects, adverse  reactions and drug interactions. Written educational materials and patient instructions outlining all of the above were given.  Follow-up: 6 months   Genia Harold, MD 04/20/2022   9:50 AM

## 2022-04-20 NOTE — Telephone Encounter (Signed)
[employer] no auth required sent to [place]

## 2022-04-20 NOTE — Patient Instructions (Addendum)
MRI brain CTA of the blood vessels in the head and neck Take indomethacin 30 minutes prior to sex to help prevent orgasm headaches  Sex headaches: In rare instances, headaches can be brought on by sexual activity -- especially with orgasm. You may notice a dull ache in the head and neck that builds up as sexual excitement increases. Or, more commonly, you may experience a sudden, severe headache just before or during orgasm.  Most sex headaches are nothing to worry about. But some can be a sign of something serious, such as problems with the blood vessels that feed the brain.  There are two types of sex headaches:  A dull ache in the head and neck that intensifies as sexual excitement increases A sudden, severe, throbbing headache that occurs just before or at the moment of orgasm In some people, both types of headaches are combined.  Most sex headaches last at least several minutes. Others may linger for hours or even 2 to 3 days.  Many people who have sex headaches will experience them in clusters over a few months, and then they may go for a year or more without having any. Up to half of all people with sex headaches experience them over the course of about six months. Some people may only have one attack during their lives.  Diagnosis Your provider will likely recommend brain imaging.  Magnetic resonance imaging (MRI). An Magnetic resonance imaging (MRI) of the brain can help detect any underlying causes for your headache. During the MRI exam, a magnetic field and radio waves are used to create cross-sectional images of the structures within the brain.  Computerized tomography (CT) angiography. These tests visualize the blood vessels leading to and inside the brain and neck.  Preventive medications If you have a history of sex headaches and there's no underlying cause, your doctor may recommend that you take preventive medications regularly. These may include:  Daily medications:  Beta blockers, for example, propranolol (Inderal, Innopran XL) or metoprolol (Lopressor, Toprol-XL) -- which are used to treat high blood pressure, coronary artery disease and migraines -- may be taken daily to prevent sex headaches. They're recommended only if you have frequent or prolonged attacks. A calcium channel blocker, such as verapamil hydrochloride (Calan SR) -- which is also used to treat high blood pressure -- may be an option. In people who have a history of migraine, other migraine preventive medications may be used.  Occasional medications: Indomethacin, an anti-inflammatory, or one of the triptans, a class of anti-migraine medications, can be taken 30 minutes to an hour before sex to prevent headaches.

## 2022-04-21 DIAGNOSIS — F411 Generalized anxiety disorder: Secondary | ICD-10-CM | POA: Diagnosis not present

## 2022-05-11 DIAGNOSIS — E785 Hyperlipidemia, unspecified: Secondary | ICD-10-CM | POA: Diagnosis not present

## 2022-05-12 DIAGNOSIS — F411 Generalized anxiety disorder: Secondary | ICD-10-CM | POA: Diagnosis not present

## 2022-05-13 DIAGNOSIS — M25562 Pain in left knee: Secondary | ICD-10-CM | POA: Diagnosis not present

## 2022-05-14 ENCOUNTER — Ambulatory Visit (HOSPITAL_COMMUNITY)
Admission: RE | Admit: 2022-05-14 | Discharge: 2022-05-14 | Disposition: A | Discharge status: Home / Self Care | Payer: BC Managed Care – PPO | Source: Ambulatory Visit | Attending: Psychiatry | Admitting: Psychiatry

## 2022-05-14 DIAGNOSIS — G4453 Primary thunderclap headache: Secondary | ICD-10-CM | POA: Insufficient documentation

## 2022-05-14 DIAGNOSIS — R911 Solitary pulmonary nodule: Secondary | ICD-10-CM | POA: Diagnosis not present

## 2022-05-14 DIAGNOSIS — G4482 Headache associated with sexual activity: Secondary | ICD-10-CM

## 2022-05-14 DIAGNOSIS — R519 Headache, unspecified: Secondary | ICD-10-CM | POA: Diagnosis not present

## 2022-05-14 DIAGNOSIS — I10 Essential (primary) hypertension: Secondary | ICD-10-CM | POA: Diagnosis not present

## 2022-05-14 MED ORDER — GADOBUTROL 1 MMOL/ML IV SOLN
5.0000 mL | Freq: Once | INTRAVENOUS | Status: AC | PRN
  Administered 2022-05-14: 5 mL via INTRAVENOUS

## 2022-05-14 MED ORDER — IOHEXOL 350 MG/ML SOLN
100.0000 mL | Freq: Once | INTRAVENOUS | Status: AC | PRN
  Administered 2022-05-14: 100 mL via INTRAVENOUS

## 2022-05-17 NOTE — Progress Notes (Signed)
Please call and advise the patient that the recent MIR Brain was within normal limits. In particular, there were no acute findings, such as a stroke, or mass or blood products.

## 2022-05-17 NOTE — Progress Notes (Signed)
Please call and inform patient the recent angiogram was within normal limits, no evidence of stenosis.

## 2022-05-18 ENCOUNTER — Telehealth: Payer: Self-pay

## 2022-05-18 ENCOUNTER — Encounter: Payer: Self-pay | Admitting: Psychiatry

## 2022-05-18 NOTE — Telephone Encounter (Signed)
Called pt and let her know her results that Dr. Teresa Coombs read. He said her angiogram was within normal limits, no evidence of stenosis. Pt verbalized understanding.

## 2022-05-19 DIAGNOSIS — M25562 Pain in left knee: Secondary | ICD-10-CM | POA: Diagnosis not present

## 2022-05-19 DIAGNOSIS — F411 Generalized anxiety disorder: Secondary | ICD-10-CM | POA: Diagnosis not present

## 2022-06-02 DIAGNOSIS — M25562 Pain in left knee: Secondary | ICD-10-CM | POA: Diagnosis not present

## 2022-06-02 DIAGNOSIS — F411 Generalized anxiety disorder: Secondary | ICD-10-CM | POA: Diagnosis not present

## 2022-06-03 DIAGNOSIS — M7052 Other bursitis of knee, left knee: Secondary | ICD-10-CM | POA: Diagnosis not present

## 2022-06-16 DIAGNOSIS — F411 Generalized anxiety disorder: Secondary | ICD-10-CM | POA: Diagnosis not present

## 2022-06-17 DIAGNOSIS — M25561 Pain in right knee: Secondary | ICD-10-CM | POA: Diagnosis not present

## 2022-06-28 DIAGNOSIS — N941 Unspecified dyspareunia: Secondary | ICD-10-CM | POA: Diagnosis not present

## 2022-06-28 DIAGNOSIS — Z79890 Hormone replacement therapy (postmenopausal): Secondary | ICD-10-CM | POA: Diagnosis not present

## 2022-06-29 DIAGNOSIS — F411 Generalized anxiety disorder: Secondary | ICD-10-CM | POA: Diagnosis not present

## 2022-07-02 DIAGNOSIS — M25562 Pain in left knee: Secondary | ICD-10-CM | POA: Diagnosis not present

## 2022-07-14 DIAGNOSIS — F411 Generalized anxiety disorder: Secondary | ICD-10-CM | POA: Diagnosis not present

## 2022-08-11 DIAGNOSIS — F411 Generalized anxiety disorder: Secondary | ICD-10-CM | POA: Diagnosis not present

## 2022-08-17 ENCOUNTER — Other Ambulatory Visit: Payer: Self-pay | Admitting: Family Medicine

## 2022-08-17 ENCOUNTER — Ambulatory Visit
Admission: RE | Admit: 2022-08-17 | Discharge: 2022-08-17 | Disposition: A | Discharge status: Home / Self Care | Payer: BC Managed Care – PPO | Source: Ambulatory Visit | Attending: Family Medicine | Admitting: Family Medicine

## 2022-08-17 DIAGNOSIS — I824Z9 Acute embolism and thrombosis of unspecified deep veins of unspecified distal lower extremity: Secondary | ICD-10-CM | POA: Diagnosis not present

## 2022-08-17 DIAGNOSIS — M7989 Other specified soft tissue disorders: Secondary | ICD-10-CM

## 2022-08-18 ENCOUNTER — Ambulatory Visit: Payer: BC Managed Care – PPO | Admitting: Psychiatry

## 2022-08-21 DIAGNOSIS — M79672 Pain in left foot: Secondary | ICD-10-CM | POA: Diagnosis not present

## 2022-09-01 DIAGNOSIS — F411 Generalized anxiety disorder: Secondary | ICD-10-CM | POA: Diagnosis not present

## 2022-09-03 DIAGNOSIS — M7742 Metatarsalgia, left foot: Secondary | ICD-10-CM | POA: Diagnosis not present

## 2022-09-14 DIAGNOSIS — F411 Generalized anxiety disorder: Secondary | ICD-10-CM | POA: Diagnosis not present

## 2022-09-28 DIAGNOSIS — N951 Menopausal and female climacteric states: Secondary | ICD-10-CM | POA: Diagnosis not present

## 2022-09-29 DIAGNOSIS — F411 Generalized anxiety disorder: Secondary | ICD-10-CM | POA: Diagnosis not present

## 2022-10-08 DIAGNOSIS — M7742 Metatarsalgia, left foot: Secondary | ICD-10-CM | POA: Diagnosis not present

## 2022-10-14 DIAGNOSIS — F411 Generalized anxiety disorder: Secondary | ICD-10-CM | POA: Diagnosis not present

## 2022-10-24 DIAGNOSIS — M79672 Pain in left foot: Secondary | ICD-10-CM | POA: Diagnosis not present

## 2022-11-01 NOTE — Progress Notes (Unsigned)
Referring:  Zelphia Cairo, MD [address], [address] [place],  [place] [address]  PCP: Shon Hale, MD  Neurology was asked to evaluate Shannon Mccullough, a 57 year old female for a chief complaint of headaches.  Our recommendations of care will be communicated by shared medical record.    CC:  headaches  History provided from self  Follow-up visit:  Prior visit: 04/20/2022 with Dr. Delena Bali (initial consult visit)  Brief HPI:   Shannon Mccullough is a 57 y.o. female with PMH of hypothyroidism and migraines who was evaluated by Dr. Delena Bali on 04/20/2022 for headaches triggered by orgasm over the past 2 years.  At prior visit, recommended completion of MRI brain and CTA head/neck.  Started on indomethacin to take 30 minutes prior to sexual activity.   Interval history:    MRI brain and CTA head/neck unremarkable.     Medical co-morbidities: hypothyroidism, migraines  The patient presents for evaluation of headaches which began 2 years ago. Headaches are triggered by orgasm. She will develop a sudden severe occipital headache at onset of orgasm which lasts for 5-6 minutes, then resolves. Headache is not associated with photophobia, phonophobia, or nausea.  She does have a history of migraines, which worsened after menopause. Her current headaches feel different from her typical migraines.  Headache History: Onset: 2 years ago Triggers: orgasm Aura: no Location: occiput Quality/Description: Associated Symptoms:  Photophobia: no  Phonophobia: no  Nausea: no Vomiting: no Worse with activity?: yes Duration of headaches: 5-6 minutes  Current Treatment: Abortive none  Preventative none  Prior Therapies                                 Gabapentin  Robaxin 500 mg PRN Flexeril 10 mg PRN Naproxen Imitrex - lack of efficacy   LABS: CBC    Component Value Date/Time   WBC 4.6 07/04/2018 0938   RBC 3.99 07/04/2018 0938   HGB 12.1 07/04/2018 0938   HCT  37.5 07/04/2018 0938   PLT 254 07/04/2018 0938   MCV 94.0 07/04/2018 0938   MCH 30.3 07/04/2018 0938   MCHC 32.3 07/04/2018 0938   RDW 12.1 07/04/2018 0938   LYMPHSABS 1.7 07/04/2018 0938   MONOABS 0.4 07/04/2018 0938   EOSABS 0.1 07/04/2018 0938   BASOSABS 0.0 07/04/2018 0938      Latest Ref Rng & Units 07/04/2018    9:38 AM 11/25/2007    9:05 PM  CMP  Glucose 70 - 99 mg/dL 84  086   BUN 6 - 20 mg/dL 12  16   Creatinine 5.78 - 1.00 mg/dL 4.69  1.0   Sodium 629 - 145 mmol/L 139  140   Potassium 3.5 - 5.1 mmol/L 4.2  4.2   Chloride 98 - 111 mmol/L 106  108   CO2 22 - 32 mmol/L 25    Calcium 8.9 - 10.3 mg/dL 9.3       IMAGING:  none  Current Outpatient Medications on File Prior to Visit  Medication Sig Dispense Refill   acetaminophen (TYLENOL) 500 MG tablet Take 1,000 mg by mouth every 6 (six) hours as needed for mild pain or headache.     CALCIUM-VITAMIN D PO Take 1 tablet by mouth at bedtime.     cetirizine (ZYRTEC) 10 MG tablet Take 10 mg by mouth at bedtime.      Estradiol 10 MCG TABS vaginal tablet Yuvafem 10 mcg vaginal  tablet     guaiFENesin (MUCINEX) 600 MG 12 hr tablet Take 600 mg by mouth at bedtime.      indomethacin (INDOCIN) 25 MG capsule Take 1-2 pills 30 minutes prior to intercourse for headache prevention 30 capsule 6   levothyroxine (SYNTHROID) 75 MCG tablet Take 1 tablet (75 mcg total) by mouth daily. 90 tablet 3   Multiple Vitamin (MULTIVITAMIN WITH MINERALS) TABS tablet Take 1 tablet by mouth at bedtime.     rosuvastatin (CRESTOR) 5 MG tablet Take 5 mg by mouth daily.     No current facility-administered medications on file prior to visit.     Allergies: Allergies  Allergen Reactions   Sulfa Antibiotics Hives   Latex Rash    Family History: Migraine or other headaches in the family:  mother and sister had migraines Aneurysms in a first degree relative:  no Brain tumors in the family:  no Other neurological illness in the family:   mother has  Alzheimers, dad had Parkinsons  Past Medical History: Past Medical History:  Diagnosis Date   Allergy    Anxiety    Arthritis    Asthma    Deviated septum    Dysrhythmia    pvc  occ   Headache    History of hiatal hernia    Hypercholesteremia    Hypothyroidism    PONV (postoperative nausea and vomiting)     Past Surgical History Past Surgical History:  Procedure Laterality Date   CARPAL TUNNEL RELEASE     CESAREAN SECTION     HERNIA REPAIR  07/05/2018   JOINT REPLACEMENT     x3 scopes   KNEE ARTHROSCOPY     LAPAROSCOPIC ASSISTED SPIGELIAN HERNIA REPAIR N/A 07/05/2018   Procedure: LAPAROSCOPIC ASSISTED SPIGELIAN HERNIA REPAIR WITH MESH;  Surgeon: Berna Bue, MD;  Location: [facility] OR;  Service: General;  Laterality: N/A;    Social History: Social History   Tobacco Use   Smoking status: Never   Smokeless tobacco: Never  Vaping Use   Vaping status: Never Used  Substance Use Topics   Alcohol use: Yes    Alcohol/week: 0.0 standard drinks of alcohol    Comment: Beer, mixed drinks 2-3 once a week   Drug use: Never    ROS: Negative for fevers, chills. Positive for headaches. All other systems reviewed and negative unless stated otherwise in HPI.   Physical Exam:   Vital Signs: LMP 06/02/2012  GENERAL: well appearing,in no acute distress,alert SKIN:  Color, texture, turgor normal. No rashes or lesions HEAD:  Normocephalic/atraumatic. CV:  RRR RESP: Normal respiratory effort MSK: no tenderness to palpation over occiput, neck, or shoulders  NEUROLOGICAL: Mental Status: Alert, oriented to person, place and time,Follows commands Cranial Nerves: PERRL, visual fields intact to confrontation, extraocular movements intact, facial sensation intact, no facial droop or ptosis, hearing grossly intact, no dysarthria Motor: muscle strength 5/5 both upper and lower extremities Reflexes: 2+ throughout Sensation: intact to light touch all 4 extremities Coordination:  Finger-to- nose-finger intact bilaterally Gait: normal-based   IMPRESSION: 57 year old female with a history of hypothyroidism, migraines who returns for follow-up of headaches associated with orgasm. MR brain and CTA head/neck unremarkable.   Will order MRI brain and CTA head/neck to rule out structural causes of thunderclap headache including vascular causes such as aneurysm/dissection. Will start indomethacin 30 minutes prior to sex to help prevent orgasm headaches. If vascular imaging is normal will also plan to start a PRN triptan for rescue.  PLAN: -  Take Indomethacin 25-50 mg 30 minutes prior to sex to prevent orgasm headache -If imaging is normal will start Zomig nasal spray for rescue -Next steps: consider propranolol for headache prevention    I spent *** minutes of face-to-face and non-face-to-face time with patient.  This included previsit chart review, lab review, study review, order entry, electronic health record documentation, patient education and discussion regarding above diagnoses and treatment plan and answered all other questions to patient's satisfaction  Ihor Austin, [facility]  [facility] [address] [place], [place] [address]  Phone [phone] Fax [phone] Note: This document was prepared with digital dictation and possible smart phrase technology. Any transcriptional errors that result from this process are unintentional.

## 2022-11-02 ENCOUNTER — Encounter: Payer: Self-pay | Admitting: Adult Health

## 2022-11-02 ENCOUNTER — Ambulatory Visit (INDEPENDENT_AMBULATORY_CARE_PROVIDER_SITE_OTHER): Payer: BC Managed Care – PPO | Admitting: Adult Health

## 2022-11-02 VITALS — BP 131/77 | HR 66 | Ht 62.0 in | Wt 150.0 lb

## 2022-11-02 DIAGNOSIS — M7742 Metatarsalgia, left foot: Secondary | ICD-10-CM | POA: Diagnosis not present

## 2022-11-02 DIAGNOSIS — G4482 Headache associated with sexual activity: Secondary | ICD-10-CM | POA: Diagnosis not present

## 2022-11-02 MED ORDER — ZOLMITRIPTAN 2.5 MG NA SOLN
2.5000 mg | NASAL | 11 refills | Status: AC | PRN
Start: 2022-11-02 — End: ?

## 2022-11-02 NOTE — Patient Instructions (Addendum)
Recommend starting Zomig nasal spray to take as needed with onset of headache  Please let me know if headache persists after use of Zomig - can consider increasing dose or changing to a different rescue medication     Follow up in 6 months or call earlier if needed

## 2022-11-03 DIAGNOSIS — F411 Generalized anxiety disorder: Secondary | ICD-10-CM | POA: Diagnosis not present

## 2022-11-23 DIAGNOSIS — D1779 Benign lipomatous neoplasm of other sites: Secondary | ICD-10-CM | POA: Diagnosis not present

## 2022-11-23 DIAGNOSIS — L578 Other skin changes due to chronic exposure to nonionizing radiation: Secondary | ICD-10-CM | POA: Diagnosis not present

## 2022-11-23 DIAGNOSIS — D2371 Other benign neoplasm of skin of right lower limb, including hip: Secondary | ICD-10-CM | POA: Diagnosis not present

## 2022-11-23 DIAGNOSIS — Z23 Encounter for immunization: Secondary | ICD-10-CM | POA: Diagnosis not present

## 2022-11-23 DIAGNOSIS — L814 Other melanin hyperpigmentation: Secondary | ICD-10-CM | POA: Diagnosis not present

## 2022-11-24 DIAGNOSIS — F411 Generalized anxiety disorder: Secondary | ICD-10-CM | POA: Diagnosis not present

## 2022-12-06 DIAGNOSIS — E785 Hyperlipidemia, unspecified: Secondary | ICD-10-CM | POA: Diagnosis not present

## 2022-12-09 ENCOUNTER — Telehealth: Payer: Self-pay

## 2022-12-09 NOTE — Telephone Encounter (Signed)
Called patient to inform her that the Rx zolmitriptan doesn't have sulfa in the medication, witch patient is allergic to Sulfa. I discussed this with McCue, NP and the nurse and agreed there no research that states Sulfa is in Zolmitriptan. Pt verbalized understanding. Pt had no questions at this time but was encouraged to call back if questions arise.

## 2022-12-14 DIAGNOSIS — F411 Generalized anxiety disorder: Secondary | ICD-10-CM | POA: Diagnosis not present

## 2022-12-21 DIAGNOSIS — M7742 Metatarsalgia, left foot: Secondary | ICD-10-CM | POA: Diagnosis not present

## 2022-12-30 DIAGNOSIS — R768 Other specified abnormal immunological findings in serum: Secondary | ICD-10-CM | POA: Diagnosis not present

## 2022-12-30 DIAGNOSIS — M549 Dorsalgia, unspecified: Secondary | ICD-10-CM | POA: Diagnosis not present

## 2022-12-30 DIAGNOSIS — Z1589 Genetic susceptibility to other disease: Secondary | ICD-10-CM | POA: Diagnosis not present

## 2022-12-30 DIAGNOSIS — M84376A Stress fracture, unspecified foot, initial encounter for fracture: Secondary | ICD-10-CM | POA: Diagnosis not present

## 2022-12-30 DIAGNOSIS — M2550 Pain in unspecified joint: Secondary | ICD-10-CM | POA: Diagnosis not present

## 2022-12-30 DIAGNOSIS — M79642 Pain in left hand: Secondary | ICD-10-CM | POA: Diagnosis not present

## 2022-12-30 DIAGNOSIS — M79641 Pain in right hand: Secondary | ICD-10-CM | POA: Diagnosis not present

## 2023-01-05 DIAGNOSIS — F411 Generalized anxiety disorder: Secondary | ICD-10-CM | POA: Diagnosis not present

## 2023-02-02 DIAGNOSIS — F411 Generalized anxiety disorder: Secondary | ICD-10-CM | POA: Diagnosis not present

## 2023-02-08 DIAGNOSIS — M7989 Other specified soft tissue disorders: Secondary | ICD-10-CM | POA: Diagnosis not present

## 2023-02-09 DIAGNOSIS — M8589 Other specified disorders of bone density and structure, multiple sites: Secondary | ICD-10-CM | POA: Diagnosis not present

## 2023-02-09 DIAGNOSIS — M2550 Pain in unspecified joint: Secondary | ICD-10-CM | POA: Diagnosis not present

## 2023-02-09 DIAGNOSIS — R768 Other specified abnormal immunological findings in serum: Secondary | ICD-10-CM | POA: Diagnosis not present

## 2023-02-09 DIAGNOSIS — M84376A Stress fracture, unspecified foot, initial encounter for fracture: Secondary | ICD-10-CM | POA: Diagnosis not present

## 2023-02-09 DIAGNOSIS — Z1589 Genetic susceptibility to other disease: Secondary | ICD-10-CM | POA: Diagnosis not present

## 2023-02-16 DIAGNOSIS — R7303 Prediabetes: Secondary | ICD-10-CM | POA: Diagnosis not present

## 2023-02-16 DIAGNOSIS — M329 Systemic lupus erythematosus, unspecified: Secondary | ICD-10-CM | POA: Diagnosis not present

## 2023-02-16 DIAGNOSIS — H9313 Tinnitus, bilateral: Secondary | ICD-10-CM | POA: Diagnosis not present

## 2023-02-16 DIAGNOSIS — F411 Generalized anxiety disorder: Secondary | ICD-10-CM | POA: Diagnosis not present

## 2023-02-16 DIAGNOSIS — E785 Hyperlipidemia, unspecified: Secondary | ICD-10-CM | POA: Diagnosis not present

## 2023-02-16 DIAGNOSIS — Z0000 Encounter for general adult medical examination without abnormal findings: Secondary | ICD-10-CM | POA: Diagnosis not present

## 2023-02-16 DIAGNOSIS — F5104 Psychophysiologic insomnia: Secondary | ICD-10-CM | POA: Diagnosis not present

## 2023-02-24 DIAGNOSIS — M7989 Other specified soft tissue disorders: Secondary | ICD-10-CM | POA: Diagnosis not present

## 2023-02-24 DIAGNOSIS — D1722 Benign lipomatous neoplasm of skin and subcutaneous tissue of left arm: Secondary | ICD-10-CM | POA: Diagnosis not present

## 2023-02-24 DIAGNOSIS — D1721 Benign lipomatous neoplasm of skin and subcutaneous tissue of right arm: Secondary | ICD-10-CM | POA: Diagnosis not present

## 2023-03-23 DIAGNOSIS — F411 Generalized anxiety disorder: Secondary | ICD-10-CM | POA: Diagnosis not present

## 2023-03-24 ENCOUNTER — Encounter: Payer: Self-pay | Admitting: Internal Medicine

## 2023-03-24 ENCOUNTER — Ambulatory Visit (INDEPENDENT_AMBULATORY_CARE_PROVIDER_SITE_OTHER): Payer: BC Managed Care – PPO | Admitting: Internal Medicine

## 2023-03-24 VITALS — BP 120/70 | HR 85 | Ht 62.0 in | Wt 143.0 lb

## 2023-03-24 DIAGNOSIS — E039 Hypothyroidism, unspecified: Secondary | ICD-10-CM | POA: Diagnosis not present

## 2023-03-24 LAB — T4, FREE: Free T4: 1.4 ng/dL (ref 0.8–1.8)

## 2023-03-24 LAB — TSH: TSH: 1.92 m[IU]/L (ref 0.40–4.50)

## 2023-03-24 NOTE — Patient Instructions (Signed)
Please continue Levothyroxine 75 mcg daily.  Take the thyroid hormone every day, with water, at least 30 minutes before breakfast, separated by at least 4 hours from: - acid reflux medications - calcium - iron - multivitamins  Please stop at the lab.  Please return in 1 year. 

## 2023-03-24 NOTE — Progress Notes (Signed)
 Patient ID: Shannon Mccullough, female   DOB: 1965-08-05, 58 y.o.   MRN: [id number]   HPI  Shannon Mccullough is a 58 y.o.-year-old female, initially referred by her PCP, Dr. Hyman Hopes, returning for follow-up for acquired hypothyroidism.  Last visit 1 year ago  Interim history: Her previous symptoms: Weight gain, fatigue, constipation, hair loss, palpitations - improved.  Her constipation improved on magnesium, probiotic. She was dx'ed with SLE - sees rheumatology.  No medications are required for now.  Reviewed and addended history: Pt. has been dx with hypothyroidism ~10 years ago >> on Synthroid d.a.w. 75 mcg >> increased to 88 mcg.  She felt better after the increase in dose. Her TFTs were too fluctuating on generic LT4. However, last month, generic prescription was sent to her pharmacy by mistake.  She did not feel different on this. LT4 dose was decreased to 75 mcg daily in 04/2021.  At that time, generic levothyroxine was sent to her pharmacy but patient mentioned that Synthroid is written on her tablets.  She takes the thyroid hormone: - fasting - with water - drinks coffee + MCT oil - separated by >30 min from b'fast  - calcium, multivitamins >> both at night - no iron, PPIs On Turmeric. Will start Berberine. On Fish oil.  I reviewed pt's thyroid tests: Lab Results  Component Value Date   TSH 2.42 03/23/2022   TSH 0.44 01/22/2022   TSH 0.25 (L) 05/12/2021   TSH 0.31 (L) 03/23/2021   TSH 1.13 09/18/2020   TSH 1.24 08/01/2020   TSH 4.84 (H) 05/05/2020   FREET4 0.84 03/23/2022   FREET4 0.98 01/22/2022   FREET4 1.31 05/12/2021   FREET4 1.05 03/23/2021   FREET4 0.93 09/18/2020   FREET4 1.01 08/01/2020   FREET4 0.97 05/05/2020   T3FREE 2.8 05/12/2021   T3FREE 2.7 05/05/2020  10/18/2019: TSH 4.45  Antithyroid antibodies were not elevated: Component     Latest Ref Rng & Units 05/05/2020  Thyroglobulin Ab     < or = 1 IU/mL <1  Thyroperoxidase Ab SerPl-aCnc     <9 IU/mL 7   She  previously described: - + weight gain - + fatigue  - + cold intolerance (cold extremities) - no depression, + anxiety - + constipation - + palpitations off and on (esp. With caffeine, insomnia) - + hair loss She continues to have hot flashes- on vaginal estrogen  since 08/2020.  No increase in palpitations.  She still has constipation-on Senokot-MiraLAX, better controlled.   Pt denies: - feeling nodules in neck - hoarseness - dysphagia - choking  She has + FH of thyroid disorders in: M, M aunt, MGM, daughter. No FH of thyroid cancer. Daughter with DM1. No h/o radiation tx to head or neck. No recent use of iodine supplements.  Pt. also has a history of prediabetes (gestational diabetes x2), hyperlipidemia, situational anxiety, migraines, recurrent UTIs, history of PVCs. She had L TKR at the beginning of 2021.  She had steroid injections in the right knee.  ROS: + See HPI  Reviewed history: Past Medical History:  Diagnosis Date   Allergy    Anxiety    Arthritis    Asthma    Deviated septum    Dysrhythmia    pvc  occ   Headache    History of hiatal hernia    Hypercholesteremia    Hypothyroidism    PONV (postoperative nausea and vomiting)    Past Surgical History:  Procedure Laterality Date   CARPAL TUNNEL RELEASE  CESAREAN SECTION     HERNIA REPAIR  07/05/2018   JOINT REPLACEMENT     x3 scopes   KNEE ARTHROSCOPY     LAPAROSCOPIC ASSISTED SPIGELIAN HERNIA REPAIR N/A 07/05/2018   Procedure: LAPAROSCOPIC ASSISTED SPIGELIAN HERNIA REPAIR WITH MESH;  Surgeon: Berna Bue, MD;  Location: [facility] OR;  Service: General;  Laterality: N/A;   Social History   Socioeconomic History   Marital status: Married    Spouse name: Not on file   Number of children: 2   Years of education: Not on file   Highest education level: Not on file  Occupational History   Occupation: Contractor  Tobacco Use   Smoking status: Never   Smokeless tobacco: Never   Vaping Use   Vaping status: Never Used  Substance and Sexual Activity   Alcohol use: Yes    Alcohol/week: 0.0 standard drinks of alcohol    Comment: Beer, mixed drinks 2-3 once a week   Drug use: Never   Sexual activity: Yes    Birth control/protection: None    Comment: Menopause  Other Topics Concern   Not on file  Social History Narrative   Right handed    Wear contacts and glasses    Drinks coffee daily    Social Drivers of Corporate investment banker Strain: Not on file  Food Insecurity: Not on file  Transportation Needs: Not on file  Physical Activity: Not on file  Stress: Not on file  Social Connections: Not on file  Intimate Partner Violence: Not on file   Current Outpatient Medications on File Prior to Visit  Medication Sig Dispense Refill   acetaminophen (TYLENOL) 500 MG tablet Take 1,000 mg by mouth every 6 (six) hours as needed for mild pain or headache.     Calcium Carb-Cholecalciferol (CALCIUM + VITAMIN D3) 600-5 MG-MCG TABS 1 tablet with a meal Orally Once a day for 30 day(s)     CALCIUM-VITAMIN D PO Take 1 tablet by mouth at bedtime.     cetirizine (ZYRTEC) 10 MG tablet Take 10 mg by mouth at bedtime.      diclofenac Sodium (VOLTAREN) 1 % GEL Apply 2 g topically 4 (four) times daily.     Estradiol 10 MCG TABS vaginal tablet Yuvafem 10 mcg vaginal tablet     Estradiol-Norethindrone Acet 0.5-0.1 MG tablet Take 1 tablet by mouth at bedtime.     guaiFENesin (MUCINEX) 600 MG 12 hr tablet Take 600 mg by mouth at bedtime.      indomethacin (INDOCIN) 25 MG capsule Take 1-2 pills 30 minutes prior to intercourse for headache prevention 30 capsule 6   levothyroxine (SYNTHROID) 75 MCG tablet Take 1 tablet (75 mcg total) by mouth daily. 90 tablet 3   meloxicam (MOBIC) 7.5 MG tablet Take 7.5 mg by mouth daily.     Multiple Vitamin (MULTIVITAMIN WITH MINERALS) TABS tablet Take 1 tablet by mouth at bedtime.     nitrofurantoin (MACRODANTIN) 50 MG capsule Take 50 mg by mouth.      Omega-3 Fatty Acids (FISH OIL) 500 MG CAPS 1 capsule Orally once a day     ZOLMitriptan 2.5 MG SOLN Place 2.5 mg into the nose as needed. 6 each 11   No current facility-administered medications on file prior to visit.   Allergies  Allergen Reactions   Sulfa Antibiotics Hives   Rosuvastatin     Other Reaction(s): foot cramps/muscle weakness   Latex Rash   Other Rash  Rosvastatin   Family History  Problem Relation Age of Onset   Arthritis Mother    Hearing loss Mother    Thyroid disease Mother    Hearing loss Father    Kidney disease Father    Breast cancer Sister 32       triple neg   Cancer Sister    Diabetes Daughter    Thyroid disease Daughter    Thyroid disease Maternal Aunt    Breast cancer Paternal Aunt    Thyroid disease Maternal Grandmother    Breast cancer Cousin        28s, paternal cousin   Cancer Other    Hyperlipidemia Other    Hypertension Other    Diabetes Other     PE: BP 120/70 (BP Location: Right Arm, Patient Position: Sitting)   Pulse 85   Ht 5\' 2"  (1.575 m)   Wt 143 lb (64.9 kg)   LMP 06/02/2012   SpO2 98%   BMI 26.16 kg/m  Wt Readings from Last 5 Encounters:  03/24/23 143 lb (64.9 kg)  11/02/22 150 lb (68 kg)  04/20/22 140 lb 12.8 oz (63.9 kg)  03/23/22 138 lb 12.8 oz (63 kg)  03/23/21 147 lb 9.6 oz (67 kg)   Constitutional: normal weight, in NAD Eyes: EOMI, no exophthalmos ENT:  no thyromegaly, no cervical lymphadenopathy Cardiovascular: RRR, No MRG Respiratory: CTA B Musculoskeletal: no deformities Skin: no rashes Neurological: no tremor with outstretched hands  ASSESSMENT: 1.  Acquired hypothyroidism  PLAN:  1. Patient with longstanding hypothyroidism, on brand-name Synthroid.  She previously had variability in her TFTs with generic levothyroxine.  She was getting Synthroid after an appeal was approved by her insurance but she was given levothyroxine by mistake from the pharmacy.  The TSH level returned suppressed so we  discussed about going back to brand-name but we ended up continuing the generic levothyroxine and increasing the dose to 75 mcg daily, on which the TFTs stabilized.  Of note, she is not feeling differently on the generic LT4. -We did discuss about Armour Thyroid and reviewed pros and cons at last visit.  We continued with LT4. -She previously had fatigue, cold intolerance, hair loss, anxiety, and weight gain, but these improved -We checked her for Hashimoto's hypothyroidism but antithyroid antibodies were not elevated in the past.  Of note, she was just diagnosed with SLE, so it is conceivable that her hypothyroidism was initially related to Hashimoto's disease - latest thyroid labs reviewed with pt. >> normal: Lab Results  Component Value Date   TSH 2.42 03/23/2022  - she continues on LT4 75 mcg daily - pt feels good on this dose.  Before last visit she gained 11 pounds but previously lost 20.  She gained 5 more pounds since last visit. - we discussed about taking the thyroid hormone every day, with water, >30 minutes before breakfast, separated by >4 hours from acid reflux medications, calcium, iron, multivitamins. Pt. is taking it correctly. - will check thyroid tests today: TSH and fT4 - If labs are abnormal, she will need to return for repeat TFTs in 1.5 months - OTW, I will see her back in a year  Needs refills for 3 mo.  Orders Placed This Encounter  Procedures   TSH   T4, free   Carlus Pavlov, MD PhD [facility] Endocrinology

## 2023-03-25 ENCOUNTER — Encounter: Payer: Self-pay | Admitting: Internal Medicine

## 2023-03-25 MED ORDER — LEVOTHYROXINE SODIUM 75 MCG PO TABS: 75.0000 ug | ORAL_TABLET | Freq: Every day | ORAL | 3 refills | Status: AC

## 2023-03-25 NOTE — Addendum Note (Signed)
 Addended by: Carlus Pavlov on: 03/25/2023 08:30 AM   Modules accepted: Orders

## 2023-04-01 DIAGNOSIS — Z6825 Body mass index (BMI) 25.0-25.9, adult: Secondary | ICD-10-CM | POA: Diagnosis not present

## 2023-04-01 DIAGNOSIS — Z01419 Encounter for gynecological examination (general) (routine) without abnormal findings: Secondary | ICD-10-CM | POA: Diagnosis not present

## 2023-04-01 DIAGNOSIS — Z1231 Encounter for screening mammogram for malignant neoplasm of breast: Secondary | ICD-10-CM | POA: Diagnosis not present

## 2023-04-05 DIAGNOSIS — F411 Generalized anxiety disorder: Secondary | ICD-10-CM | POA: Diagnosis not present

## 2023-04-20 DIAGNOSIS — F411 Generalized anxiety disorder: Secondary | ICD-10-CM | POA: Diagnosis not present

## 2023-04-26 ENCOUNTER — Telehealth (INDEPENDENT_AMBULATORY_CARE_PROVIDER_SITE_OTHER): Payer: Self-pay | Admitting: Otolaryngology

## 2023-04-26 NOTE — Telephone Encounter (Signed)
 Confirmed date and location for both appts on 04/27/2023.

## 2023-04-27 ENCOUNTER — Ambulatory Visit (INDEPENDENT_AMBULATORY_CARE_PROVIDER_SITE_OTHER): Payer: BC Managed Care – PPO | Admitting: Otolaryngology

## 2023-04-27 ENCOUNTER — Encounter (INDEPENDENT_AMBULATORY_CARE_PROVIDER_SITE_OTHER): Payer: Self-pay

## 2023-04-27 ENCOUNTER — Ambulatory Visit (INDEPENDENT_AMBULATORY_CARE_PROVIDER_SITE_OTHER): Payer: BC Managed Care – PPO | Admitting: Audiology

## 2023-04-27 VITALS — BP 105/70 | HR 74 | Ht 62.0 in | Wt 138.0 lb

## 2023-04-27 DIAGNOSIS — R42 Dizziness and giddiness: Secondary | ICD-10-CM

## 2023-04-27 DIAGNOSIS — H9313 Tinnitus, bilateral: Secondary | ICD-10-CM

## 2023-04-27 DIAGNOSIS — H903 Sensorineural hearing loss, bilateral: Secondary | ICD-10-CM

## 2023-04-27 NOTE — Progress Notes (Signed)
 Dear Dr. Chanetta Marshall, Here is my assessment for our mutual patient, Shannon Mccullough. Thank you for allowing me the opportunity to care for your patient. Please do not hesitate to contact me should you have any other questions. Sincerely, Dr. Jovita Kussmaul  Otolaryngology Clinic Note Referring provider: Dr. Chanetta Marshall HPI:  Shannon Mccullough is a 58 y.o. female kindly referred by Dr. Chanetta Marshall for evaluation of bilateral tinnitus.  Initial visit (04/2023): Patient reports: has had bilateral non-pulsatile tinnitus for a couple of years, but worse now. She reports it now bothers her at night, and has to drown it out with white noise. She also reports hearing trouble ongoing chronically and slowly declining. She does report that she also had an episode of vertigo after she got out of bed -- occurred for about 3-4 hours -- but has since resolved. She had some nausea, no fullness or tinnitus (nothing worse than usual), no hearing change. Has not occurred since. She did try the Epley maneuver, but did not help significantly. No visual symptoms, headache, no numbness of extremities. Did not use Meclizine. No recent medication changes. Patient denies: ear pain, fullness, vertigo, drainage Patient additionally denies: deep pain in ear canal, eustachian tube symptoms such as popping/crackling, sensitivity to pressure changes Patient also denies barotrauma, vestibular suppressant use, ototoxic medication use Prior ear surgery: no No frequent ear infections. No FHX of hearing loss Some noise exposure - concerts  H&N Surgery: no Personal or FHx of bleeding dz or anesthesia difficulty: no  GLP-1: no AP/AC: no  Tobacco: no.   PMHx: Hypothyroidism, Allergy, Lupus (but asymptomatic) - no meds, GAD, HLD  Independent Review of Additional Tests or Records:  04/2023 [name] was independently reviewed and interpreted by me and it reveals - symmetric downsloping SNHL that is not worst in the low frequencies; WRT 92%  at 75dB AU; b/l A/A tymps   SNHL= Sensorineural hearing loss  Dr. Chanetta Marshall (FM) 02/21/2023 referral notes reviewed and uploaded or available in chart - noted noted have some vertigo about 20 years ago, and then another one in Jan 2025; also with tinnitus; Dx: Tinnitus and vertigo; Rx: Epley, ref to ENT Labs reviewed and uploaded or available in chart in media tab:  CMP (02/16/2023): Bun/Cr 14/0.75, no significant EL abnormalities; CBC (02/16/2023): WBC 5.2, Plt 282  MRI 04/2022 independently interpreted with respect to ears: no IAC cuts but no retrocochlear lesions; mastoids well aerated  PMH/Meds/All/SocHx/FamHx/ROS:   Past Medical History:  Diagnosis Date   Allergy    Anxiety    Arthritis    Asthma    Deviated septum    Dysrhythmia    pvc  occ   Headache    History of hiatal hernia    Hypercholesteremia    Hypothyroidism    PONV (postoperative nausea and vomiting)      Past Surgical History:  Procedure Laterality Date   CARPAL TUNNEL RELEASE     CESAREAN SECTION     HERNIA REPAIR  07/05/2018   JOINT REPLACEMENT     x3 scopes   KNEE ARTHROSCOPY     LAPAROSCOPIC ASSISTED SPIGELIAN HERNIA REPAIR N/A 07/05/2018   Procedure: LAPAROSCOPIC ASSISTED SPIGELIAN HERNIA REPAIR WITH MESH;  Surgeon: Berna Bue, MD;  Location: [facility] OR;  Service: General;  Laterality: N/A;    Family History  Problem Relation Age of Onset   Arthritis Mother    Hearing loss Mother    Thyroid disease Mother    Hearing loss Father    Kidney disease Father  Breast cancer Sister 51       triple neg   Cancer Sister    Diabetes Daughter    Thyroid disease Daughter    Thyroid disease Maternal Aunt    Breast cancer Paternal Aunt    Thyroid disease Maternal Grandmother    Breast cancer Cousin        29s, paternal cousin   Cancer Other    Hyperlipidemia Other    Hypertension Other    Diabetes Other      Social Connections: Not on file      Current Outpatient Medications:    acetaminophen  (TYLENOL) 500 MG tablet, Take 1,000 mg by mouth every 6 (six) hours as needed for mild pain or headache., Disp: , Rfl:    azelastine (ASTELIN) 0.1 % nasal spray, Place into both nostrils 2 (two) times daily. Use in each nostril as directed, Disp: , Rfl:    CALCIUM-VITAMIN D PO, Take 1 tablet by mouth at bedtime., Disp: , Rfl:    cetirizine (ZYRTEC) 10 MG tablet, Take 10 mg by mouth at bedtime. , Disp: , Rfl:    Estradiol 10 MCG TABS vaginal tablet, Yuvafem 10 mcg vaginal tablet, Disp: , Rfl:    Estradiol-Norethindrone Acet 0.5-0.1 MG tablet, Take 1 tablet by mouth at bedtime., Disp: , Rfl:    guaiFENesin (MUCINEX) 600 MG 12 hr tablet, Take 600 mg by mouth at bedtime. , Disp: , Rfl:    levothyroxine (SYNTHROID) 75 MCG tablet, Take 1 tablet (75 mcg total) by mouth daily., Disp: 90 tablet, Rfl: 3   meloxicam (MOBIC) 7.5 MG tablet, Take 7.5 mg by mouth daily., Disp: , Rfl:    Multiple Vitamin (MULTIVITAMIN WITH MINERALS) TABS tablet, Take 1 tablet by mouth at bedtime., Disp: , Rfl:    nitrofurantoin (MACRODANTIN) 50 MG capsule, Take 50 mg by mouth., Disp: , Rfl:    Omega-3 Fatty Acids (FISH OIL) 500 MG CAPS, 1 capsule Orally once a day, Disp: , Rfl:    ZOLMitriptan 2.5 MG SOLN, Place 2.5 mg into the nose as needed., Disp: 6 each, Rfl: 11   Calcium Carb-Cholecalciferol (CALCIUM + VITAMIN D3) 600-5 MG-MCG TABS, 1 tablet with a meal Orally Once a day for 30 day(s) (Patient not taking: Reported on 04/27/2023), Disp: , Rfl:    diclofenac Sodium (VOLTAREN) 1 % GEL, Apply 2 g topically 4 (four) times daily. (Patient not taking: Reported on 04/27/2023), Disp: , Rfl:    indomethacin (INDOCIN) 25 MG capsule, Take 1-2 pills 30 minutes prior to intercourse for headache prevention (Patient not taking: Reported on 04/27/2023), Disp: 30 capsule, Rfl: 6   Physical Exam:   BP 105/70 (BP Location: Left Arm, Patient Position: Sitting, Cuff Size: Normal)   Pulse 74   Ht 5\' 2"  (1.575 m)   Wt 138 lb (62.6 kg)   LMP 06/02/2012    SpO2 95%   BMI 25.24 kg/m   Salient findings:  CN II-XII intact Given history and complaints, ear microscopy was indicated and performed for evaluation with findings as below in physical exam section and in procedures; Bilateral EAC clear and TM intact with well pneumatized middle ear spaces Weber 512: mid Rinne 512: AC > BC b/l  No gross nystagmus Anterior rhinoscopy: Septum intact; bilateral inferior turbinates without significant hypertrophy No lesions of oral cavity/oropharynx No obviously palpable neck masses/lymphadenopathy/thyromegaly No respiratory distress or stridor  Seprately Identifiable Procedures:  Procedure: Bilateral ear microscopy using microscope (CPT 92504) Pre-procedure diagnosis: vertigo, tinnitus Post-procedure diagnosis: same Indication:  see above; given patient's otologic complaints and history, for improved and comprehensive examination of external ear and tympanic membrane, bilateral otologic examination using microscope was performed  Procedure: Patient was placed semi-recumbent. Both ear canals were examined using the microscope with findings above. Patient tolerated the procedure well.   Impression & Plans:  Maleia Weems is a 58 y.o. female with:  1. Bilateral tinnitus   2. Sensorineural hearing loss (SNHL) of both ears   3. Vertigo    Audio shows generally symmetric SNHL; noted b/l tinnitus as well; we discussed options including masking, retraining, and HA; she will think about Has We also discussed her vertigo -- has had one episode and DDX is wide and could include migraine as well as meniere's (though no other meniere's sx accompanying). Prior MRI reassuring; we discussed options including empiric Rx for meniere's, though with no other sx or additional episodes, will just observe for now D/w pt, she will call should she have additional vertigo episodes or other changes in hearing; recommend hearing recheck 2-3 years  See below regarding exact  medications prescribed this encounter including dosages and route: No orders of the defined types were placed in this encounter.     Thank you for allowing me the opportunity to care for your patient. Please do not hesitate to contact me should you have any other questions.  Sincerely, Jovita Kussmaul, MD Otolaryngologist (ENT), [facility] Phone: [phone] Fax: [phone]  04/27/2023, 11:02 AM   I have personally spent 49 minutes involved in face-to-face and non-face-to-face activities for this patient on the day of the visit.  Professional time spent excludes any procedures performed but includes the following activities, in addition to those noted in the documentation: preparing to see the patient (review of outside documentation and results), performing a medically appropriate examination, exstensive counseling, documenting in the electronic health record, independently interpreting results (MRI).

## 2023-04-28 NOTE — Progress Notes (Signed)
  [address], [address] [place], [place] [address] [phone]  Audiological Evaluation    Name: Shannon Mccullough     DOB:   February 18, 1965      MRN:   [id number]                                                                                     Service Date: 04/28/2023     Accompanied by: unaccompanied   Patient comes today after Dr. Allena Katz, ENT sent a referral for a hearing evaluation due to concerns with tinnitus.   Symptoms Yes Details  Hearing loss  [x]  Longstanding   Tinnitus  [x]  Longstanding   Ear pain/ infections/pressure  []    Balance problems  [x]  2 months ago had vertigo for 3-4 hours, movement made it worse- has not had it again.  Noise exposure history  [x]  concerts  Previous ear surgeries  []    Family history of hearing loss  [x]  Parents with age  Amplification  []    Other  []      Otoscopy: Right ear: Clear external ear canals and notable landmarks visualized on the tympanic membrane. Left ear:  Clear external ear canals and notable landmarks visualized on the tympanic membrane.  Tympanometry: Right ear: Type A- Normal external ear canal volume with normal middle ear pressure and tympanic membrane compliance Left ear: Type A- Normal external ear canal volume with normal middle ear pressure and tympanic membrane compliance  Pure tone Audiometry: Right ear- Normal to borderline normal hearing from [phone] Hz, then mild to moderate sensorineural hearing loss from 3000 Hz - 8000 Hz. Left ear-  Normal to borderline normal hearing from 125-250 Hz and from 1000-2000 Hz, mild sensorineural hearing loss 500 Hz and from 3000-80000 Hz.  Speech Audiometry: Right ear- Speech Reception Threshold (SRT) was obtained at 20 dBHL. Left ear-Speech Reception Threshold (SRT) was obtained at 25 dBHL.   Word Recognition Score Tested using NU-6 (MLV) Right ear: 92% was obtained at a presentation level of 75 dBHL with contralateral masking which is deemed as  excellent. Left ear: 92%  was obtained at a presentation level of 75 dBHL with contralateral masking which is deemed as  excellent.   The hearing test results were completed under headphones and re-checked with inserts and results are deemed to be of good reliability. Test technique:  conventional    Recommendations: Follow up with ENT as scheduled for today. Return for a hearing evaluation if concerns with hearing changes arise or per MD recommendation. Consider various tinnitus strategies, including the use of a sound generator, hearing aids, and/or tinnitus retraining therapy.  Consider a communication needs assessment after medical clearance for hearing aids is obtained.   Ansleigh Safer MARIE LEROUX-MARTINEZ, AUD

## 2023-05-11 DIAGNOSIS — F411 Generalized anxiety disorder: Secondary | ICD-10-CM | POA: Diagnosis not present

## 2023-05-17 DIAGNOSIS — M1990 Unspecified osteoarthritis, unspecified site: Secondary | ICD-10-CM | POA: Diagnosis not present

## 2023-05-17 DIAGNOSIS — R768 Other specified abnormal immunological findings in serum: Secondary | ICD-10-CM | POA: Diagnosis not present

## 2023-05-17 DIAGNOSIS — M79643 Pain in unspecified hand: Secondary | ICD-10-CM | POA: Diagnosis not present

## 2023-05-17 DIAGNOSIS — Z1589 Genetic susceptibility to other disease: Secondary | ICD-10-CM | POA: Diagnosis not present

## 2023-05-17 DIAGNOSIS — M329 Systemic lupus erythematosus, unspecified: Secondary | ICD-10-CM | POA: Diagnosis not present

## 2023-05-19 ENCOUNTER — Encounter: Payer: Self-pay | Admitting: Adult Health

## 2023-05-19 ENCOUNTER — Telehealth (INDEPENDENT_AMBULATORY_CARE_PROVIDER_SITE_OTHER): Payer: BC Managed Care – PPO | Admitting: Adult Health

## 2023-05-19 DIAGNOSIS — Z0289 Encounter for other administrative examinations: Secondary | ICD-10-CM

## 2023-05-19 NOTE — Progress Notes (Signed)
 Patient scheduled for follow-up visit via MyChart video visit but patient's microphone not connecting.  Attempted to contact patient via telephone and continue to receive voicemail after multiple attempts, voicemail message left but did not receive return call.  Patient will need to be rescheduled at this time.

## 2023-05-23 DIAGNOSIS — K1379 Other lesions of oral mucosa: Secondary | ICD-10-CM | POA: Diagnosis not present

## 2023-05-25 DIAGNOSIS — S161XXA Strain of muscle, fascia and tendon at neck level, initial encounter: Secondary | ICD-10-CM | POA: Diagnosis not present

## 2023-05-31 DIAGNOSIS — F411 Generalized anxiety disorder: Secondary | ICD-10-CM | POA: Diagnosis not present

## 2023-06-22 DIAGNOSIS — F411 Generalized anxiety disorder: Secondary | ICD-10-CM | POA: Diagnosis not present

## 2023-07-27 DIAGNOSIS — F411 Generalized anxiety disorder: Secondary | ICD-10-CM | POA: Diagnosis not present

## 2023-08-31 DIAGNOSIS — F411 Generalized anxiety disorder: Secondary | ICD-10-CM | POA: Diagnosis not present

## 2023-09-28 DIAGNOSIS — F411 Generalized anxiety disorder: Secondary | ICD-10-CM | POA: Diagnosis not present

## 2023-10-26 DIAGNOSIS — F411 Generalized anxiety disorder: Secondary | ICD-10-CM | POA: Diagnosis not present

## 2023-11-03 DIAGNOSIS — K5900 Constipation, unspecified: Secondary | ICD-10-CM | POA: Diagnosis not present

## 2023-11-03 DIAGNOSIS — Z1211 Encounter for screening for malignant neoplasm of colon: Secondary | ICD-10-CM | POA: Diagnosis not present

## 2023-11-16 DIAGNOSIS — M329 Systemic lupus erythematosus, unspecified: Secondary | ICD-10-CM | POA: Diagnosis not present

## 2023-11-16 DIAGNOSIS — M8580 Other specified disorders of bone density and structure, unspecified site: Secondary | ICD-10-CM | POA: Diagnosis not present

## 2023-11-16 DIAGNOSIS — Z1589 Genetic susceptibility to other disease: Secondary | ICD-10-CM | POA: Diagnosis not present

## 2023-11-16 DIAGNOSIS — M1990 Unspecified osteoarthritis, unspecified site: Secondary | ICD-10-CM | POA: Diagnosis not present

## 2023-11-23 DIAGNOSIS — F411 Generalized anxiety disorder: Secondary | ICD-10-CM | POA: Diagnosis not present

## 2023-11-24 DIAGNOSIS — L578 Other skin changes due to chronic exposure to nonionizing radiation: Secondary | ICD-10-CM | POA: Diagnosis not present

## 2023-11-24 DIAGNOSIS — D2371 Other benign neoplasm of skin of right lower limb, including hip: Secondary | ICD-10-CM | POA: Diagnosis not present

## 2023-11-24 DIAGNOSIS — D1779 Benign lipomatous neoplasm of other sites: Secondary | ICD-10-CM | POA: Diagnosis not present

## 2023-11-24 DIAGNOSIS — L814 Other melanin hyperpigmentation: Secondary | ICD-10-CM | POA: Diagnosis not present

## 2023-12-09 DIAGNOSIS — K5730 Diverticulosis of large intestine without perforation or abscess without bleeding: Secondary | ICD-10-CM | POA: Diagnosis not present

## 2023-12-09 DIAGNOSIS — Z1211 Encounter for screening for malignant neoplasm of colon: Secondary | ICD-10-CM | POA: Diagnosis not present

## 2023-12-19 DIAGNOSIS — F411 Generalized anxiety disorder: Secondary | ICD-10-CM | POA: Diagnosis not present

## 2024-01-24 DIAGNOSIS — F411 Generalized anxiety disorder: Secondary | ICD-10-CM | POA: Diagnosis not present

## 2024-03-23 ENCOUNTER — Ambulatory Visit: Payer: BC Managed Care – PPO | Admitting: Internal Medicine

## 2024-04-30 IMAGING — CT CT CARDIAC CORONARY ARTERY CALCIUM SCORE
3 series · 14 of 20 positions shown, 16 images · non-contrast
Comparison: None Available.
COMPARISON: None Available.

Addendum:
EXAM:
OVER-READ INTERPRETATION  CT CHEST

The following report is a limited chest CT over-read performed by
06/15/2021. The coronary calcium score interpretation by the
cardiologist is attached.
CLINICAL DATA: Cardiovascular Disease Risk stratification
Coronary Calcium Score
TECHNIQUE: A gated, non-contrast computed tomography scan of the heart was
performed using 3mm slice thickness. Axial images were analyzed on a
dedicated workstation. Calcium scoring of the coronary arteries was
performed using the Agatston method.

[Series 2: ax lung · axial · 0.71mm/px · z∈[+163,+253]mm · 5 of 69 slices shown]
[im 12/69  lung]
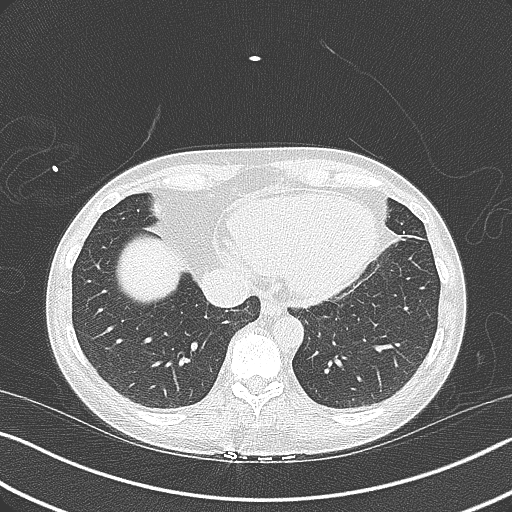
[im 23/69  lung]
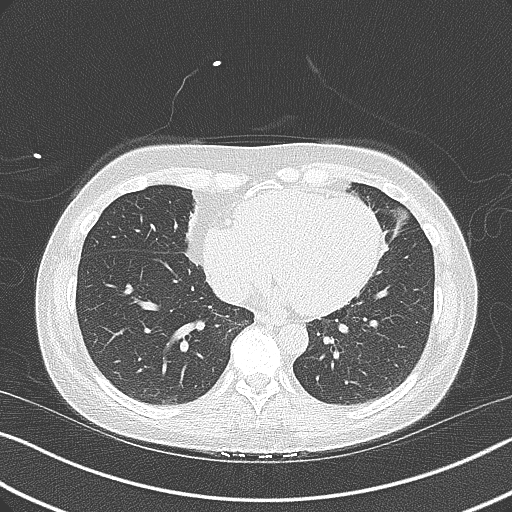
[im 35/69  lung]
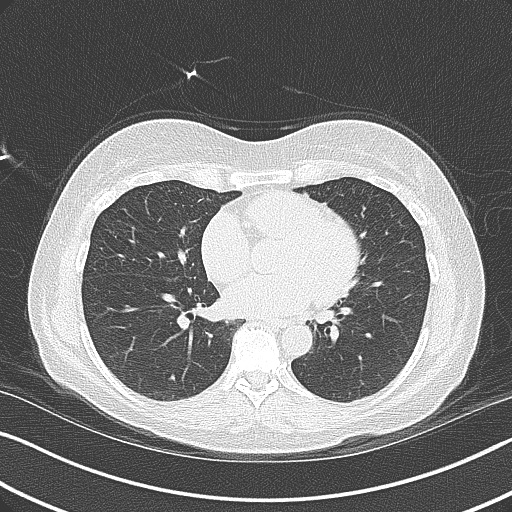
[im 46/69  lung]
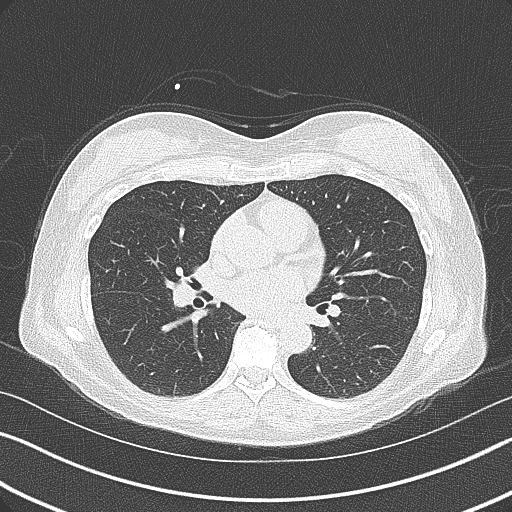
[im 57/69  lung]
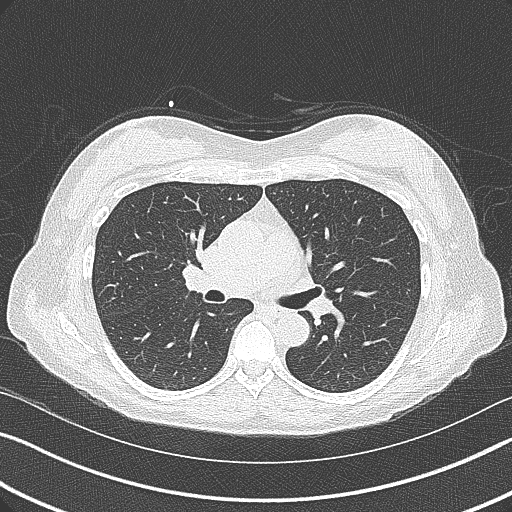

[Series 3: cascseq 3.0 sa36 70% (id) · axial · 0.32mm/px · z∈[+174,+240]mm · 3 of 46 slices shown]
[im 12/46  vessel]
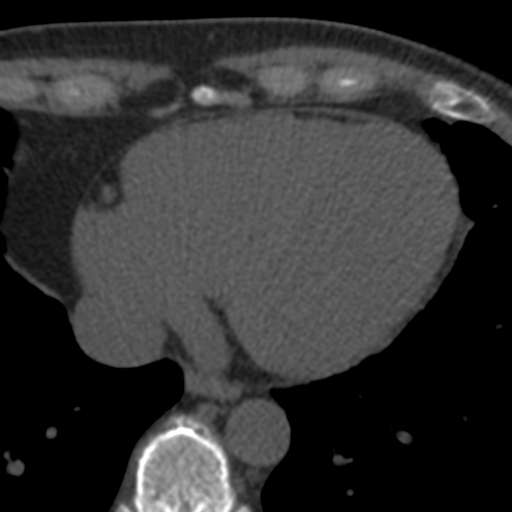
[im 23/46  vessel]
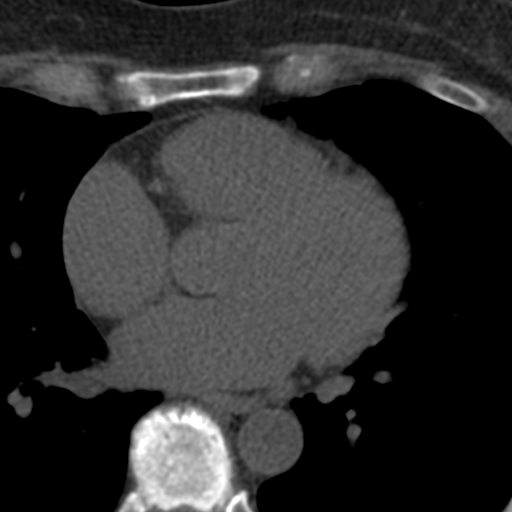
[im 34/46  vessel]
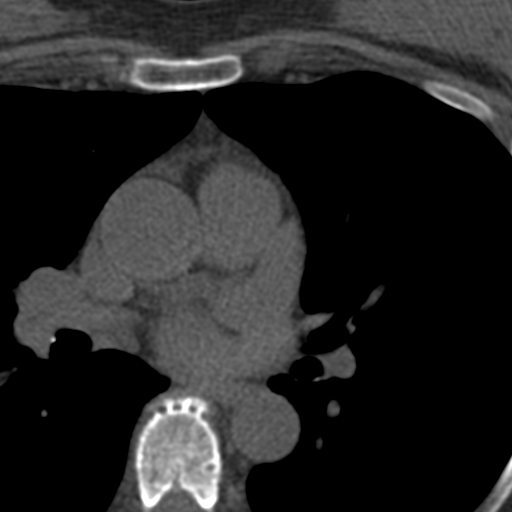

[Series 4: ax st · axial · 0.71mm/px · z∈[+159,+257]mm · 6 of 69 slices shown, 8 images]
[im 10/69  vessel]
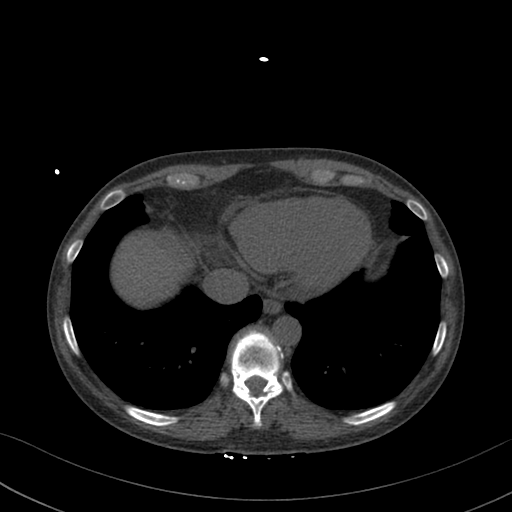
[im 10/69  lung]
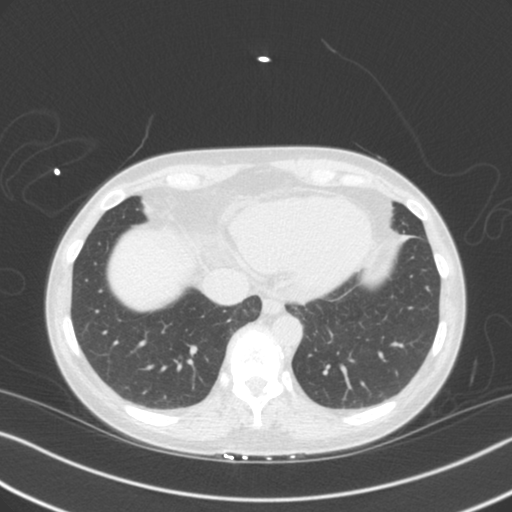
[im 20/69  vessel]
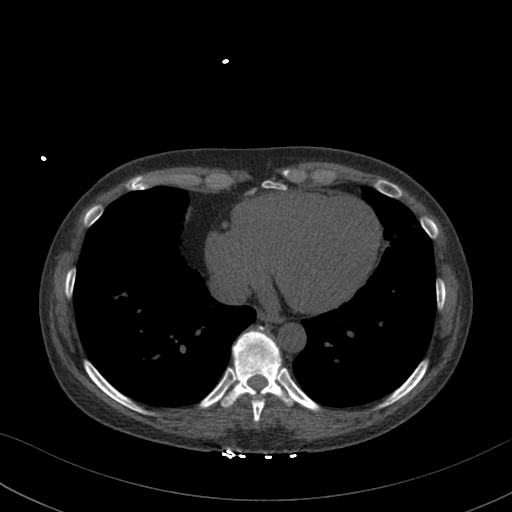
[im 30/69  vessel]
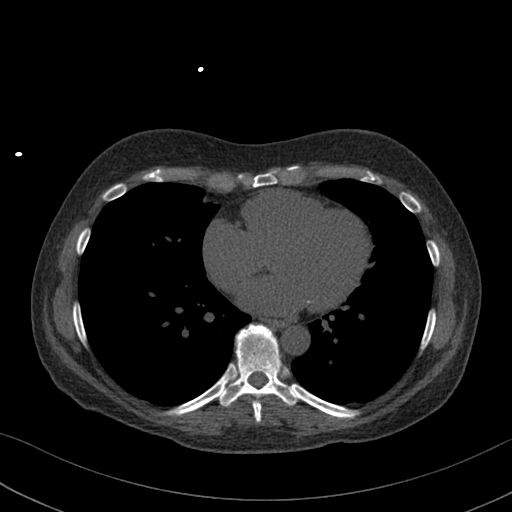
[im 39/69  vessel]
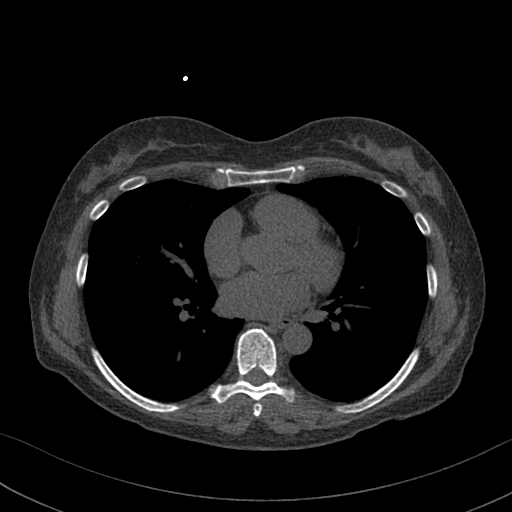
[im 49/69  vessel]
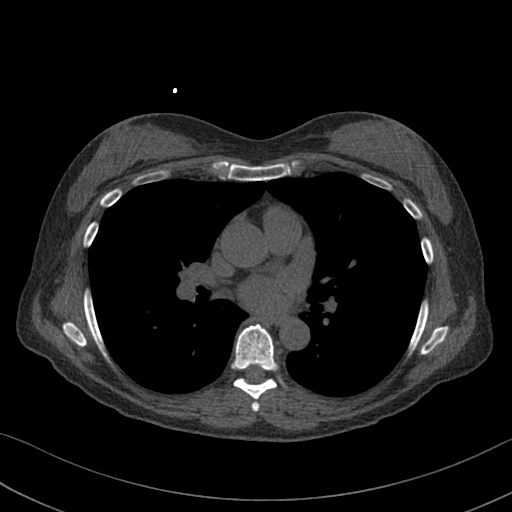
[im 49/69  lung]
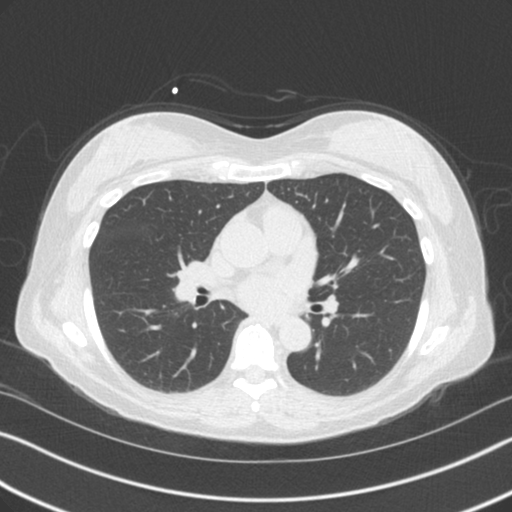
[im 59/69  vessel]
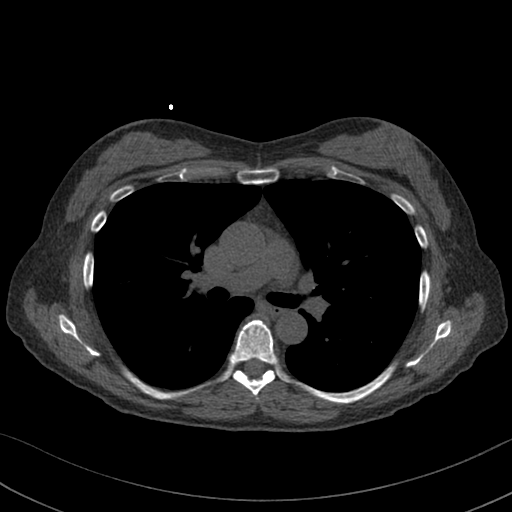

[14 of 20 positions shown; findings below may reference images not displayed]

FINDINGS: Vascular: The visualized portion of the extracardiac vascular
structures are unremarkable on these unenhanced images.

Mediastinum/Nodes: The visualized portion of the mediastinum is
unremarkable.

Lungs/Pleura: Visualized portion of pulmonary parenchyma is
unremarkable.

Upper Abdomen: Visualized portion of upper abdomen is unremarkable.

Musculoskeletal: Visualized portion of skeletal structures are
unremarkable.
IMPRESSION: No definite abnormality seen involving the visualized portions of
the extracardiac structures of the chest.
FINDINGS: Coronary Calcium Score:

Left main: 0

Left anterior descending artery: 0

Left circumflex artery: 0

Right coronary artery: 0

Total: 0

Percentile: NA

Pericardium: Normal.

Non-cardiac: See separate report from [REDACTED].
IMPRESSION: Coronary calcium score of 0.



If CAC=0, it is reasonable to withhold statin therapy and reassess
in 5 to 10 years, as long as higher risk conditions are absent
(diabetes mellitus, family history of premature CHD in first degree
relatives (males <55 years; females <65 years), cigarette smoking,
or LDL >=190 mg/dL).

If CAC is 1 to 99, it is reasonable to initiate statin therapy for
patients >=55 years of age.

If CAC is >=100 or >=75th percentile, it is reasonable to initiate
statin therapy at any age.

Cardiology referral should be considered for patients with CAC
scores >=400 or >=75th percentile.

*9324 AHA/ACC/AACVPR/AAPA/ABC/NAAHI/GIORGI/AHMADE/Sato/VIL/MAYOLIE/MYHRE
Guideline on the Management of Blood Cholesterol: A Report of the
American College of Cardiology/American Heart Association Task Force
on Clinical Practice Guidelines. J Am Coll Cardiol.
7439;73(24):5454-5022.

*** End of Addendum ***
EXAM:
OVER-READ INTERPRETATION  CT CHEST

The following report is a limited chest CT over-read performed by
06/15/2021. The coronary calcium score interpretation by the
cardiologist is attached.
FINDINGS: Vascular: The visualized portion of the extracardiac vascular
structures are unremarkable on these unenhanced images.

Mediastinum/Nodes: The visualized portion of the mediastinum is
unremarkable.

Lungs/Pleura: Visualized portion of pulmonary parenchyma is
unremarkable.

Upper Abdomen: Visualized portion of upper abdomen is unremarkable.

Musculoskeletal: Visualized portion of skeletal structures are
unremarkable.
IMPRESSION: No definite abnormality seen involving the visualized portions of
the extracardiac structures of the chest.
